# Patient Record
Sex: Male | Born: 1935
Health system: Southern US, Community
[De-identification: ages and names within clinical notes are randomized; demographics above are authoritative.]

## PROBLEM LIST (undated history)

## (undated) DIAGNOSIS — I10 Essential (primary) hypertension: Secondary | ICD-10-CM

## (undated) DIAGNOSIS — J449 Chronic obstructive pulmonary disease, unspecified: Secondary | ICD-10-CM

---

## 2015-02-03 DIAGNOSIS — B9620 Unspecified Escherichia coli [E. coli] as the cause of diseases classified elsewhere: Secondary | ICD-10-CM | POA: Diagnosis not present

## 2015-02-03 DIAGNOSIS — Z87891 Personal history of nicotine dependence: Secondary | ICD-10-CM | POA: Diagnosis not present

## 2015-02-03 DIAGNOSIS — A4151 Sepsis due to Escherichia coli [E. coli]: Secondary | ICD-10-CM | POA: Diagnosis not present

## 2015-02-03 DIAGNOSIS — R069 Unspecified abnormalities of breathing: Secondary | ICD-10-CM | POA: Diagnosis not present

## 2015-02-03 DIAGNOSIS — R05 Cough: Secondary | ICD-10-CM | POA: Diagnosis not present

## 2015-02-03 DIAGNOSIS — I160 Hypertensive urgency: Secondary | ICD-10-CM | POA: Diagnosis not present

## 2015-02-03 DIAGNOSIS — J441 Chronic obstructive pulmonary disease with (acute) exacerbation: Secondary | ICD-10-CM | POA: Diagnosis not present

## 2015-02-03 DIAGNOSIS — R0602 Shortness of breath: Secondary | ICD-10-CM | POA: Diagnosis not present

## 2015-02-03 DIAGNOSIS — R918 Other nonspecific abnormal finding of lung field: Secondary | ICD-10-CM | POA: Diagnosis not present

## 2015-02-03 DIAGNOSIS — Z87440 Personal history of urinary (tract) infections: Secondary | ICD-10-CM | POA: Diagnosis not present

## 2015-02-03 DIAGNOSIS — N179 Acute kidney failure, unspecified: Secondary | ICD-10-CM | POA: Diagnosis not present

## 2015-02-03 DIAGNOSIS — J181 Lobar pneumonia, unspecified organism: Secondary | ICD-10-CM | POA: Diagnosis not present

## 2015-02-03 DIAGNOSIS — N390 Urinary tract infection, site not specified: Secondary | ICD-10-CM | POA: Diagnosis not present

## 2015-02-03 DIAGNOSIS — J180 Bronchopneumonia, unspecified organism: Secondary | ICD-10-CM | POA: Diagnosis not present

## 2015-02-03 DIAGNOSIS — J9601 Acute respiratory failure with hypoxia: Secondary | ICD-10-CM | POA: Diagnosis not present

## 2015-02-03 DIAGNOSIS — E860 Dehydration: Secondary | ICD-10-CM | POA: Diagnosis not present

## 2015-02-03 DIAGNOSIS — J155 Pneumonia due to Escherichia coli: Secondary | ICD-10-CM | POA: Diagnosis not present

## 2015-02-16 DIAGNOSIS — Z1389 Encounter for screening for other disorder: Secondary | ICD-10-CM | POA: Diagnosis not present

## 2015-02-16 DIAGNOSIS — Z79899 Other long term (current) drug therapy: Secondary | ICD-10-CM | POA: Diagnosis not present

## 2015-02-16 DIAGNOSIS — Z789 Other specified health status: Secondary | ICD-10-CM | POA: Diagnosis not present

## 2015-02-16 DIAGNOSIS — I10 Essential (primary) hypertension: Secondary | ICD-10-CM | POA: Diagnosis not present

## 2015-02-16 DIAGNOSIS — Z125 Encounter for screening for malignant neoplasm of prostate: Secondary | ICD-10-CM | POA: Diagnosis not present

## 2015-02-16 DIAGNOSIS — J449 Chronic obstructive pulmonary disease, unspecified: Secondary | ICD-10-CM | POA: Diagnosis not present

## 2015-02-16 DIAGNOSIS — Z9181 History of falling: Secondary | ICD-10-CM | POA: Diagnosis not present

## 2015-03-23 DIAGNOSIS — I10 Essential (primary) hypertension: Secondary | ICD-10-CM | POA: Diagnosis not present

## 2015-03-29 DIAGNOSIS — R110 Nausea: Secondary | ICD-10-CM | POA: Diagnosis not present

## 2015-03-29 DIAGNOSIS — R61 Generalized hyperhidrosis: Secondary | ICD-10-CM | POA: Diagnosis not present

## 2015-03-29 DIAGNOSIS — R911 Solitary pulmonary nodule: Secondary | ICD-10-CM | POA: Diagnosis not present

## 2015-03-29 DIAGNOSIS — R531 Weakness: Secondary | ICD-10-CM | POA: Diagnosis not present

## 2015-03-29 DIAGNOSIS — J439 Emphysema, unspecified: Secondary | ICD-10-CM | POA: Diagnosis not present

## 2015-03-29 DIAGNOSIS — R197 Diarrhea, unspecified: Secondary | ICD-10-CM | POA: Diagnosis not present

## 2015-03-29 DIAGNOSIS — R42 Dizziness and giddiness: Secondary | ICD-10-CM | POA: Diagnosis not present

## 2015-03-29 DIAGNOSIS — R404 Transient alteration of awareness: Secondary | ICD-10-CM | POA: Diagnosis not present

## 2015-06-23 DIAGNOSIS — Z87891 Personal history of nicotine dependence: Secondary | ICD-10-CM | POA: Diagnosis not present

## 2015-06-23 DIAGNOSIS — R0602 Shortness of breath: Secondary | ICD-10-CM | POA: Diagnosis not present

## 2015-06-23 DIAGNOSIS — J209 Acute bronchitis, unspecified: Secondary | ICD-10-CM | POA: Diagnosis not present

## 2015-06-23 DIAGNOSIS — R05 Cough: Secondary | ICD-10-CM | POA: Diagnosis not present

## 2015-06-23 DIAGNOSIS — M1990 Unspecified osteoarthritis, unspecified site: Secondary | ICD-10-CM | POA: Diagnosis not present

## 2015-06-23 DIAGNOSIS — N189 Chronic kidney disease, unspecified: Secondary | ICD-10-CM | POA: Diagnosis not present

## 2015-06-23 DIAGNOSIS — I129 Hypertensive chronic kidney disease with stage 1 through stage 4 chronic kidney disease, or unspecified chronic kidney disease: Secondary | ICD-10-CM | POA: Diagnosis not present

## 2015-06-23 DIAGNOSIS — R0902 Hypoxemia: Secondary | ICD-10-CM | POA: Diagnosis not present

## 2015-06-23 DIAGNOSIS — Z79899 Other long term (current) drug therapy: Secondary | ICD-10-CM | POA: Diagnosis not present

## 2015-06-23 DIAGNOSIS — J441 Chronic obstructive pulmonary disease with (acute) exacerbation: Secondary | ICD-10-CM | POA: Diagnosis not present

## 2015-07-18 DIAGNOSIS — R5381 Other malaise: Secondary | ICD-10-CM | POA: Diagnosis not present

## 2015-07-18 DIAGNOSIS — R5383 Other fatigue: Secondary | ICD-10-CM | POA: Diagnosis not present

## 2015-07-18 DIAGNOSIS — N183 Chronic kidney disease, stage 3 (moderate): Secondary | ICD-10-CM | POA: Diagnosis not present

## 2015-07-18 DIAGNOSIS — J449 Chronic obstructive pulmonary disease, unspecified: Secondary | ICD-10-CM | POA: Diagnosis not present

## 2015-07-18 DIAGNOSIS — D649 Anemia, unspecified: Secondary | ICD-10-CM | POA: Diagnosis not present

## 2015-12-01 DIAGNOSIS — L300 Nummular dermatitis: Secondary | ICD-10-CM | POA: Diagnosis not present

## 2015-12-01 DIAGNOSIS — R233 Spontaneous ecchymoses: Secondary | ICD-10-CM | POA: Diagnosis not present

## 2015-12-01 DIAGNOSIS — C44311 Basal cell carcinoma of skin of nose: Secondary | ICD-10-CM | POA: Diagnosis not present

## 2015-12-31 DIAGNOSIS — M1990 Unspecified osteoarthritis, unspecified site: Secondary | ICD-10-CM | POA: Diagnosis not present

## 2015-12-31 DIAGNOSIS — M6281 Muscle weakness (generalized): Secondary | ICD-10-CM | POA: Diagnosis not present

## 2015-12-31 DIAGNOSIS — J449 Chronic obstructive pulmonary disease, unspecified: Secondary | ICD-10-CM | POA: Diagnosis not present

## 2015-12-31 DIAGNOSIS — J181 Lobar pneumonia, unspecified organism: Secondary | ICD-10-CM | POA: Diagnosis not present

## 2015-12-31 DIAGNOSIS — J984 Other disorders of lung: Secondary | ICD-10-CM | POA: Diagnosis not present

## 2015-12-31 DIAGNOSIS — J439 Emphysema, unspecified: Secondary | ICD-10-CM | POA: Diagnosis not present

## 2015-12-31 DIAGNOSIS — Z79899 Other long term (current) drug therapy: Secondary | ICD-10-CM | POA: Diagnosis not present

## 2015-12-31 DIAGNOSIS — I10 Essential (primary) hypertension: Secondary | ICD-10-CM | POA: Diagnosis not present

## 2015-12-31 DIAGNOSIS — J9601 Acute respiratory failure with hypoxia: Secondary | ICD-10-CM | POA: Diagnosis not present

## 2015-12-31 DIAGNOSIS — Z87891 Personal history of nicotine dependence: Secondary | ICD-10-CM | POA: Diagnosis not present

## 2015-12-31 DIAGNOSIS — J9690 Respiratory failure, unspecified, unspecified whether with hypoxia or hypercapnia: Secondary | ICD-10-CM | POA: Diagnosis not present

## 2015-12-31 DIAGNOSIS — R531 Weakness: Secondary | ICD-10-CM | POA: Diagnosis not present

## 2015-12-31 DIAGNOSIS — R848 Other abnormal findings in specimens from respiratory organs and thorax: Secondary | ICD-10-CM | POA: Diagnosis not present

## 2015-12-31 DIAGNOSIS — B9689 Other specified bacterial agents as the cause of diseases classified elsewhere: Secondary | ICD-10-CM | POA: Diagnosis not present

## 2015-12-31 DIAGNOSIS — J440 Chronic obstructive pulmonary disease with acute lower respiratory infection: Secondary | ICD-10-CM | POA: Diagnosis not present

## 2015-12-31 DIAGNOSIS — R911 Solitary pulmonary nodule: Secondary | ICD-10-CM | POA: Diagnosis not present

## 2015-12-31 DIAGNOSIS — R7989 Other specified abnormal findings of blood chemistry: Secondary | ICD-10-CM | POA: Diagnosis not present

## 2015-12-31 DIAGNOSIS — J189 Pneumonia, unspecified organism: Secondary | ICD-10-CM | POA: Diagnosis not present

## 2016-02-03 DIAGNOSIS — I10 Essential (primary) hypertension: Secondary | ICD-10-CM | POA: Diagnosis not present

## 2016-02-03 DIAGNOSIS — Z23 Encounter for immunization: Secondary | ICD-10-CM | POA: Diagnosis not present

## 2016-02-03 DIAGNOSIS — A310 Pulmonary mycobacterial infection: Secondary | ICD-10-CM | POA: Diagnosis not present

## 2016-02-10 DIAGNOSIS — E860 Dehydration: Secondary | ICD-10-CM | POA: Diagnosis not present

## 2016-02-10 DIAGNOSIS — K8020 Calculus of gallbladder without cholecystitis without obstruction: Secondary | ICD-10-CM | POA: Diagnosis not present

## 2016-02-10 DIAGNOSIS — R197 Diarrhea, unspecified: Secondary | ICD-10-CM | POA: Diagnosis not present

## 2016-02-16 DIAGNOSIS — K529 Noninfective gastroenteritis and colitis, unspecified: Secondary | ICD-10-CM | POA: Diagnosis not present

## 2016-05-21 DIAGNOSIS — L300 Nummular dermatitis: Secondary | ICD-10-CM | POA: Diagnosis not present

## 2016-05-21 DIAGNOSIS — L821 Other seborrheic keratosis: Secondary | ICD-10-CM | POA: Diagnosis not present

## 2016-05-21 DIAGNOSIS — L570 Actinic keratosis: Secondary | ICD-10-CM | POA: Diagnosis not present

## 2016-05-21 DIAGNOSIS — L578 Other skin changes due to chronic exposure to nonionizing radiation: Secondary | ICD-10-CM | POA: Diagnosis not present

## 2016-06-04 DIAGNOSIS — C4442 Squamous cell carcinoma of skin of scalp and neck: Secondary | ICD-10-CM | POA: Diagnosis not present

## 2016-06-04 DIAGNOSIS — L578 Other skin changes due to chronic exposure to nonionizing radiation: Secondary | ICD-10-CM | POA: Diagnosis not present

## 2016-06-04 DIAGNOSIS — L570 Actinic keratosis: Secondary | ICD-10-CM | POA: Diagnosis not present

## 2016-06-04 DIAGNOSIS — L300 Nummular dermatitis: Secondary | ICD-10-CM | POA: Diagnosis not present

## 2016-06-04 DIAGNOSIS — C44212 Basal cell carcinoma of skin of right ear and external auricular canal: Secondary | ICD-10-CM | POA: Diagnosis not present

## 2016-06-04 DIAGNOSIS — C44219 Basal cell carcinoma of skin of left ear and external auricular canal: Secondary | ICD-10-CM | POA: Diagnosis not present

## 2016-07-29 ENCOUNTER — Ambulatory Visit (HOSPITAL_COMMUNITY)
Admission: EM | Admit: 2016-07-29 | Discharge: 2016-07-29 | Disposition: A | Discharge status: Home / Self Care | Payer: Medicare HMO | Attending: Emergency Medicine | Admitting: Emergency Medicine

## 2016-07-29 ENCOUNTER — Emergency Department (HOSPITAL_COMMUNITY): Payer: Medicare HMO | Admitting: Certified Registered Nurse Anesthetist

## 2016-07-29 ENCOUNTER — Ambulatory Visit (HOSPITAL_COMMUNITY): Admit: 2016-07-29 | Payer: Medicare HMO | Admitting: Gastroenterology

## 2016-07-29 ENCOUNTER — Encounter (HOSPITAL_COMMUNITY): Payer: Self-pay

## 2016-07-29 ENCOUNTER — Encounter (HOSPITAL_COMMUNITY)
Admission: EM | Disposition: A | Discharge status: Home / Self Care | Payer: Self-pay | Source: Home / Self Care | Attending: Emergency Medicine

## 2016-07-29 DIAGNOSIS — E860 Dehydration: Secondary | ICD-10-CM | POA: Diagnosis not present

## 2016-07-29 DIAGNOSIS — R1310 Dysphagia, unspecified: Secondary | ICD-10-CM | POA: Diagnosis not present

## 2016-07-29 DIAGNOSIS — R410 Disorientation, unspecified: Secondary | ICD-10-CM | POA: Diagnosis not present

## 2016-07-29 DIAGNOSIS — K269 Duodenal ulcer, unspecified as acute or chronic, without hemorrhage or perforation: Secondary | ICD-10-CM | POA: Insufficient documentation

## 2016-07-29 DIAGNOSIS — R0602 Shortness of breath: Secondary | ICD-10-CM | POA: Diagnosis not present

## 2016-07-29 DIAGNOSIS — T18108A Unspecified foreign body in esophagus causing other injury, initial encounter: Secondary | ICD-10-CM

## 2016-07-29 DIAGNOSIS — J449 Chronic obstructive pulmonary disease, unspecified: Secondary | ICD-10-CM | POA: Diagnosis not present

## 2016-07-29 DIAGNOSIS — T18128A Food in esophagus causing other injury, initial encounter: Secondary | ICD-10-CM | POA: Insufficient documentation

## 2016-07-29 DIAGNOSIS — I10 Essential (primary) hypertension: Secondary | ICD-10-CM | POA: Insufficient documentation

## 2016-07-29 DIAGNOSIS — K210 Gastro-esophageal reflux disease with esophagitis: Secondary | ICD-10-CM | POA: Insufficient documentation

## 2016-07-29 DIAGNOSIS — K222 Esophageal obstruction: Secondary | ICD-10-CM | POA: Diagnosis not present

## 2016-07-29 HISTORY — PX: ESOPHAGOGASTRODUODENOSCOPY: SHX5428

## 2016-07-29 HISTORY — DX: Chronic obstructive pulmonary disease, unspecified: J44.9

## 2016-07-29 HISTORY — DX: Essential (primary) hypertension: I10

## 2016-07-29 LAB — POCT I-STAT, CHEM 8
BUN: 35 mg/dL — ABNORMAL HIGH (ref 6–20)
Calcium, Ion: 1.24 mmol/L (ref 1.15–1.40)
Chloride: 108 mmol/L (ref 101–111)
Creatinine, Ser: 1.1 mg/dL (ref 0.61–1.24)
GLUCOSE: 79 mg/dL (ref 65–99)
HCT: 35 % — ABNORMAL LOW (ref 39.0–52.0)
HEMOGLOBIN: 11.9 g/dL — AB (ref 13.0–17.0)
Potassium: 4.3 mmol/L (ref 3.5–5.1)
SODIUM: 142 mmol/L (ref 135–145)
TCO2: 25 mmol/L (ref 0–100)

## 2016-07-29 SURGERY — EGD (ESOPHAGOGASTRODUODENOSCOPY)
Anesthesia: General

## 2016-07-29 MED ORDER — ONDANSETRON HCL 4 MG/2ML IJ SOLN
INTRAMUSCULAR | Status: DC | PRN
  Administered 2016-07-29: 4 mg via INTRAVENOUS

## 2016-07-29 MED ORDER — LACTATED RINGERS IV SOLN
INTRAVENOUS | Status: DC | PRN
  Administered 2016-07-29: 10:00:00 via INTRAVENOUS

## 2016-07-29 MED ORDER — FENTANYL CITRATE (PF) 100 MCG/2ML IJ SOLN
INTRAMUSCULAR | Status: DC | PRN
  Administered 2016-07-29: 100 ug via INTRAVENOUS

## 2016-07-29 MED ORDER — PROPOFOL 10 MG/ML IV BOLUS
INTRAVENOUS | Status: DC | PRN
  Administered 2016-07-29: 110 mg via INTRAVENOUS

## 2016-07-29 MED ORDER — LIDOCAINE HCL (CARDIAC) 20 MG/ML IV SOLN
INTRAVENOUS | Status: DC | PRN
  Administered 2016-07-29: 100 mg via INTRAVENOUS

## 2016-07-29 MED ORDER — SUCCINYLCHOLINE CHLORIDE 20 MG/ML IJ SOLN
INTRAMUSCULAR | Status: DC | PRN
  Administered 2016-07-29: 140 mg via INTRAVENOUS

## 2016-07-29 MED ORDER — MEPERIDINE HCL 100 MG/ML IJ SOLN: 6.2500 mg | INTRAMUSCULAR | Status: DC | PRN

## 2016-07-29 MED ORDER — HYDROMORPHONE HCL 1 MG/ML IJ SOLN: 0.2500 mg | INTRAMUSCULAR | Status: DC | PRN

## 2016-07-29 MED ORDER — ONDANSETRON HCL 4 MG/2ML IJ SOLN: 4.0000 mg | Freq: Once | INTRAMUSCULAR | Status: DC | PRN

## 2016-07-29 NOTE — ED Provider Notes (Signed)
Patient transferred from another facility.  On exam he is awake, alert, in no distress. Vital signs unremarkable, I discussed transfer with EMS providers. Subsequently I discussed the patient's case with our GI colleagues, for whom the patient was sent due to need for evaluation.   Carmin Muskrat, MD 07/29/16 [phone]

## 2016-07-29 NOTE — Op Note (Addendum)
[facility] Patient Name: Ryan Hodge Procedure Date : 07/29/2016 MRN: [id number] Attending MD: Juanita Craver , MD Date of Birth: Jan 01, 1936 CSN: [id number] Age: 81 Admit Type: Inpatient Procedure:                EGD with removal of food impaction. Indications:              Dysphagia, Gastro-esophageal reflux disease. Providers:                Juanita Craver, MD, Vista Lawman, RN, Cletis Athens,                            Technician Referring MD:             Aggie Cosier, MD, Christa See, MD Medicines:                Total IV Anesthesia (TIVA) Complications:            No immediate complications. Estimated Blood Loss:     Estimated blood loss was minimal. Procedure:                Pre-anesthesia assessment:- Prior to the procedure,                            a history and physical was performed, and patient                            medications and allergies were reviewed. The                            patient's tolerance of previous anesthesia was also                            reviewed. The risks and benefits of the procedure                            and the sedation options and risks were discussed                            with the patient. All questions were answered, and                            informed consent was obtained. Prior                            anticoagulants: The patient has taken previous                            NSAID medication, last dose was 3 days prior to                            procedure. ASA Grade assessment: II - A patient                            with mild systemic disease. After reviewing the  risks and benefits, the patient was deemed in                            satisfactory condition to undergo the procedure.                            After obtaining informed consent, the endoscope was                            passed under direct vision. Throughout the   procedure, the patient's blood pressure, pulse, and                            oxygen saturations were monitored continuously. The                            EG-[id number] ([id number]) scope was introduced through the                            mouth, and advanced to the second part of duodenum.                            The EGD was done without difficulty. The patient                            tolerated the procedure well. Scope In: Scope Out: Findings:      A large meat bolus was noted, impacted at the gastroesophageal junction       @ 40 cm; this was broken up into smaller fragments with a Talon grasper       and then gently "pushed" into the stomach; Grade IV esophagitis was       noted in the distal esophagus after the bolus was removed with minimal       bleeding. Estimated blood loss was minimal.      The entire examined stomach was normal.      The cardia and gastric fundus were normal on retroflexion.      One small non-bleeding superficial duodenal ulcer with no stigmata of       bleeding was found in the duodenal bulb.      The first portion of the duodenum was normal. Impression:               - Large meat bolus impacted at GEJ-removed with a                            Talon grasper-see description above.                           - Grade IV esophagitis noted in the distal                            esophagus.                           - Normal stomach.                           -  One non-bleeding duodenal ulcer in the duodenal                            bulb with no stigmata of bleeding.                           - Normal first portion of the duodenum. Moderate Sedation:      MAC used. Recommendation:           - Mechanical soft diet for now.                           - Continue present medications.                           - Use Prilosec (Omeprazole) 40 mg PO daily.                           - Crafate suspension 1 gm PO QID for 1 month.                           - No  Aspirin, Ibuprofen, Naproxen, or other                            non-steroidal anti-inflammatory drugs for now.                           - Return to your GI doctor, Dr. Margaretha Sheffield in 1                            week. Procedure Code(s):        --- Professional ---                           [phone], Esophagogastroduodenoscopy, flexible,                            transoral; with removal of foreign body(s) Diagnosis Code(s):        --- Professional ---                           R13.10, Dysphagia, unspecified                           K21.0, Gastro-esophageal reflux disease with                            esophagitis                           K26.9, Duodenal ulcer, unspecified as acute or                            chronic, without hemorrhage or perforation                           A45.364W, Food in esophagus causing  other injury,                            initial encounter CPT copyright 2016 [employer]. All rights reserved. The codes documented in this report are preliminary and upon coder review may  be revised to meet current compliance requirements. Juanita Craver, MD Juanita Craver, MD 07/29/2016 10:56:37 AM This report has been signed electronically. Number of Addenda: 0

## 2016-07-29 NOTE — Anesthesia Procedure Notes (Signed)
Procedure Name: Intubation Date/Time: 07/29/2016 10:29 AM Performed by: Oletta Lamas Pre-anesthesia Checklist: Patient identified, Emergency Drugs available, Suction available and Patient being monitored Patient Re-evaluated:Patient Re-evaluated prior to inductionOxygen Delivery Method: Circle System Utilized Preoxygenation: Pre-oxygenation with 100% oxygen Intubation Type: IV induction, Rapid sequence and Cricoid Pressure applied Laryngoscope Size: Mac and 4 Grade View: Grade I Tube type: Oral Tube size: 8.0 mm Number of attempts: 1 Airway Equipment and Method: Stylet Placement Confirmation: ETT inserted through vocal cords under direct vision,  positive ETCO2 and breath sounds checked- equal and bilateral Secured at: 23 cm Tube secured with: Tape Dental Injury: Teeth and Oropharynx as per pre-operative assessment

## 2016-07-29 NOTE — ED Triage Notes (Signed)
Pt brought by [facility] from [place] for swallowing difficulty and nausea. Pt c/o not being able to swallow any foods/liquids for 2-3 days. Pt a&ox4. Pt is HOH.

## 2016-07-29 NOTE — Transfer of Care (Signed)
Immediate Anesthesia Transfer of Care Note  Patient: Ryan Hodge  Procedure(s) Performed: Procedure(s): ESOPHAGOGASTRODUODENOSCOPY (EGD) (N/A)  Patient Location: PACU  Anesthesia Type:General  Level of Consciousness: drowsy and patient cooperative  Airway & Oxygen Therapy: Patient Spontanous Breathing and Patient connected to nasal cannula oxygen  Post-op Assessment: Report given to RN and Post -op Vital signs reviewed and stable  Post vital signs: Reviewed and stable  Last Vitals:  Vitals:   07/29/16 0930 07/29/16 0955  BP: 131/60 (!) 163/57  Pulse: 69 76  Resp:  15  Temp:  36.7 C    Last Pain:  Vitals:   07/29/16 0955  TempSrc: Oral         Complications: No apparent anesthesia complications

## 2016-07-29 NOTE — Anesthesia Preprocedure Evaluation (Signed)
Anesthesia Evaluation  Patient identified by MRN, date of birth, ID band Patient awake    Reviewed: Allergy & Precautions, NPO status , Patient's Chart, lab work & pertinent test results  Airway Mallampati: I  TM Distance: >3 FB Neck ROM: Full    Dental   Pulmonary COPD,    Pulmonary exam normal        Cardiovascular hypertension, Pt. on medications Normal cardiovascular exam     Neuro/Psych    GI/Hepatic   Endo/Other    Renal/GU      Musculoskeletal   Abdominal   Peds  Hematology   Anesthesia Other Findings   Reproductive/Obstetrics                             Anesthesia Physical Anesthesia Plan  ASA: III  Anesthesia Plan: General   Post-op Pain Management:    Induction: Intravenous  Airway Management Planned: Oral ETT  Additional Equipment:   Intra-op Plan:   Post-operative Plan: Extubation in OR  Informed Consent: I have reviewed the patients History and Physical, chart, labs and discussed the procedure including the risks, benefits and alternatives for the proposed anesthesia with the patient or authorized representative who has indicated his/her understanding and acceptance.     Plan Discussed with: CRNA and Surgeon  Anesthesia Plan Comments:         Anesthesia Quick Evaluation

## 2016-07-29 NOTE — Anesthesia Postprocedure Evaluation (Signed)
Anesthesia Post Note  Patient: Ryan Hodge  Procedure(s) Performed: Procedure(s) (LRB): ESOPHAGOGASTRODUODENOSCOPY (EGD) (N/A)  Patient location during evaluation: PACU Anesthesia Type: General Level of consciousness: awake and alert Pain management: pain level controlled Vital Signs Assessment: post-procedure vital signs reviewed and stable Respiratory status: spontaneous breathing, nonlabored ventilation, respiratory function stable and patient connected to nasal cannula oxygen Cardiovascular status: blood pressure returned to baseline and stable Postop Assessment: no signs of nausea or vomiting Anesthetic complications: no       Last Vitals:  Vitals:   07/29/16 1106 07/29/16 1121  BP: 125/75 138/79  Pulse: 83 74  Resp: 19 19  Temp:      Last Pain:  Vitals:   07/29/16 0955  TempSrc: Oral                 [name]

## 2016-07-29 NOTE — Consult Note (Addendum)
Unassigned patient Ryan Hodge, water quality Reason for Consult: Food impaction/difficulty swallowing. Referring Physician: ER MD-Dr. Vanita Panda.  Ryan Hodge is an 81 y.o. male.  HPI: 81 year old white male here for removal of a food impcction that he has had for 3 days. He had some chicken 3 days and has not been able to swallow any solids or liquids since then.  PMH: HTN, COD, HOH.  No past surgical history on file.  No family history on file.  Social History:  has no tobacco, alcohol, and drug history on file.  Allergies: Allergies not on file  Medications: I have reviewed the patient's current medications.  No results found for this or any previous visit (from the past 48 hour(s)).  No results found.  Review of Systems  Constitutional: Negative.   HENT: Negative.   Eyes: Negative.   Respiratory: Negative.   Cardiovascular: Negative.   Gastrointestinal: Positive for heartburn, nausea and vomiting. Negative for abdominal pain, blood in stool, constipation, diarrhea and melena.  Genitourinary: Negative.   Musculoskeletal: Positive for joint pain.  Skin: Negative.   Neurological: Negative.   Endo/Heme/Allergies: Negative.   Psychiatric/Behavioral: Negative.    Blood pressure 132/70, pulse 81, temperature 98.3 F (36.8 C), temperature source Oral, resp. rate 18, height 6\' 2"  (1.88 m), weight 74.8 kg (165 lb), SpO2 94 %. Physical Exam  Constitutional: He is oriented to person, place, and time. He appears well-developed and well-nourished.  HENT:  Head: Normocephalic and atraumatic.  Right Ear: Decreased hearing is noted.  edentulous  Eyes: Conjunctivae and EOM are normal. Pupils are equal, round, and reactive to light.  Neck: Normal range of motion. Neck supple.  Cardiovascular: Normal rate and regular rhythm.   Respiratory: Effort normal and breath sounds normal.  GI: Soft. Bowel sounds are normal.  Musculoskeletal: Normal range of motion.  Neurological: He is  alert and oriented to person, place, and time.  Skin: Skin is warm and dry.  Psychiatric: He has a normal mood and affect. His behavior is normal. Judgment and thought content normal.   Assessment/Plan: Dysphagia/food impaction-proceed with a EGD at this time.  Grier Czerwinski 07/29/2016, 9:08 AM

## 2016-12-11 DIAGNOSIS — L299 Pruritus, unspecified: Secondary | ICD-10-CM | POA: Diagnosis not present

## 2016-12-11 DIAGNOSIS — L300 Nummular dermatitis: Secondary | ICD-10-CM | POA: Diagnosis not present

## 2016-12-18 DIAGNOSIS — Z01818 Encounter for other preprocedural examination: Secondary | ICD-10-CM | POA: Diagnosis not present

## 2016-12-18 DIAGNOSIS — H2511 Age-related nuclear cataract, right eye: Secondary | ICD-10-CM | POA: Diagnosis not present

## 2016-12-24 DIAGNOSIS — H2511 Age-related nuclear cataract, right eye: Secondary | ICD-10-CM | POA: Diagnosis not present

## 2016-12-24 DIAGNOSIS — H25811 Combined forms of age-related cataract, right eye: Secondary | ICD-10-CM | POA: Diagnosis not present

## 2016-12-29 DIAGNOSIS — R55 Syncope and collapse: Secondary | ICD-10-CM | POA: Diagnosis not present

## 2016-12-29 DIAGNOSIS — R531 Weakness: Secondary | ICD-10-CM | POA: Diagnosis not present

## 2016-12-29 DIAGNOSIS — E860 Dehydration: Secondary | ICD-10-CM | POA: Diagnosis not present

## 2016-12-29 DIAGNOSIS — R404 Transient alteration of awareness: Secondary | ICD-10-CM | POA: Diagnosis not present

## 2017-01-02 DIAGNOSIS — R55 Syncope and collapse: Secondary | ICD-10-CM | POA: Diagnosis not present

## 2017-01-02 DIAGNOSIS — Z23 Encounter for immunization: Secondary | ICD-10-CM | POA: Diagnosis not present

## 2017-01-02 DIAGNOSIS — Z1389 Encounter for screening for other disorder: Secondary | ICD-10-CM | POA: Diagnosis not present

## 2017-01-02 DIAGNOSIS — Z0000 Encounter for general adult medical examination without abnormal findings: Secondary | ICD-10-CM | POA: Diagnosis not present

## 2017-01-02 DIAGNOSIS — I10 Essential (primary) hypertension: Secondary | ICD-10-CM | POA: Diagnosis not present

## 2017-01-02 DIAGNOSIS — Z9181 History of falling: Secondary | ICD-10-CM | POA: Diagnosis not present

## 2017-02-26 DIAGNOSIS — L300 Nummular dermatitis: Secondary | ICD-10-CM | POA: Diagnosis not present

## 2017-02-26 DIAGNOSIS — L299 Pruritus, unspecified: Secondary | ICD-10-CM | POA: Diagnosis not present

## 2017-06-12 DIAGNOSIS — L578 Other skin changes due to chronic exposure to nonionizing radiation: Secondary | ICD-10-CM | POA: Diagnosis not present

## 2017-06-12 DIAGNOSIS — T7840XA Allergy, unspecified, initial encounter: Secondary | ICD-10-CM | POA: Diagnosis not present

## 2017-06-12 DIAGNOSIS — L300 Nummular dermatitis: Secondary | ICD-10-CM | POA: Diagnosis not present

## 2017-08-08 DIAGNOSIS — K8020 Calculus of gallbladder without cholecystitis without obstruction: Secondary | ICD-10-CM | POA: Diagnosis not present

## 2017-08-08 DIAGNOSIS — R0902 Hypoxemia: Secondary | ICD-10-CM | POA: Diagnosis not present

## 2017-08-08 DIAGNOSIS — J181 Lobar pneumonia, unspecified organism: Secondary | ICD-10-CM | POA: Diagnosis not present

## 2017-08-08 DIAGNOSIS — K5730 Diverticulosis of large intestine without perforation or abscess without bleeding: Secondary | ICD-10-CM | POA: Diagnosis not present

## 2017-08-08 DIAGNOSIS — R739 Hyperglycemia, unspecified: Secondary | ICD-10-CM | POA: Diagnosis not present

## 2017-08-08 DIAGNOSIS — I10 Essential (primary) hypertension: Secondary | ICD-10-CM | POA: Diagnosis not present

## 2017-08-08 DIAGNOSIS — N179 Acute kidney failure, unspecified: Secondary | ICD-10-CM | POA: Diagnosis not present

## 2017-08-08 DIAGNOSIS — J449 Chronic obstructive pulmonary disease, unspecified: Secondary | ICD-10-CM | POA: Diagnosis not present

## 2017-08-08 DIAGNOSIS — J440 Chronic obstructive pulmonary disease with acute lower respiratory infection: Secondary | ICD-10-CM | POA: Diagnosis not present

## 2017-08-08 DIAGNOSIS — I160 Hypertensive urgency: Secondary | ICD-10-CM | POA: Diagnosis not present

## 2017-08-08 DIAGNOSIS — I719 Aortic aneurysm of unspecified site, without rupture: Secondary | ICD-10-CM | POA: Diagnosis not present

## 2017-08-08 DIAGNOSIS — A419 Sepsis, unspecified organism: Secondary | ICD-10-CM | POA: Diagnosis not present

## 2017-08-08 DIAGNOSIS — I714 Abdominal aortic aneurysm, without rupture: Secondary | ICD-10-CM | POA: Diagnosis not present

## 2017-08-08 DIAGNOSIS — B9620 Unspecified Escherichia coli [E. coli] as the cause of diseases classified elsewhere: Secondary | ICD-10-CM | POA: Diagnosis not present

## 2017-08-08 DIAGNOSIS — R8271 Bacteriuria: Secondary | ICD-10-CM | POA: Diagnosis not present

## 2017-08-08 DIAGNOSIS — J9601 Acute respiratory failure with hypoxia: Secondary | ICD-10-CM | POA: Diagnosis not present

## 2017-08-09 DIAGNOSIS — I719 Aortic aneurysm of unspecified site, without rupture: Secondary | ICD-10-CM | POA: Diagnosis not present

## 2017-08-09 DIAGNOSIS — I160 Hypertensive urgency: Secondary | ICD-10-CM | POA: Diagnosis not present

## 2017-08-09 DIAGNOSIS — J181 Lobar pneumonia, unspecified organism: Secondary | ICD-10-CM | POA: Diagnosis not present

## 2017-08-09 DIAGNOSIS — I10 Essential (primary) hypertension: Secondary | ICD-10-CM | POA: Diagnosis not present

## 2017-08-09 DIAGNOSIS — N179 Acute kidney failure, unspecified: Secondary | ICD-10-CM | POA: Diagnosis not present

## 2017-08-09 DIAGNOSIS — J9601 Acute respiratory failure with hypoxia: Secondary | ICD-10-CM | POA: Diagnosis not present

## 2017-08-09 DIAGNOSIS — J449 Chronic obstructive pulmonary disease, unspecified: Secondary | ICD-10-CM | POA: Diagnosis not present

## 2017-08-10 DIAGNOSIS — N179 Acute kidney failure, unspecified: Secondary | ICD-10-CM | POA: Diagnosis not present

## 2017-08-10 DIAGNOSIS — J181 Lobar pneumonia, unspecified organism: Secondary | ICD-10-CM | POA: Diagnosis not present

## 2017-08-10 DIAGNOSIS — J9601 Acute respiratory failure with hypoxia: Secondary | ICD-10-CM | POA: Diagnosis not present

## 2017-08-10 DIAGNOSIS — I10 Essential (primary) hypertension: Secondary | ICD-10-CM | POA: Diagnosis not present

## 2017-08-10 DIAGNOSIS — I719 Aortic aneurysm of unspecified site, without rupture: Secondary | ICD-10-CM | POA: Diagnosis not present

## 2017-08-10 DIAGNOSIS — I160 Hypertensive urgency: Secondary | ICD-10-CM | POA: Diagnosis not present

## 2017-08-10 DIAGNOSIS — J449 Chronic obstructive pulmonary disease, unspecified: Secondary | ICD-10-CM | POA: Diagnosis not present

## 2017-08-11 DIAGNOSIS — J181 Lobar pneumonia, unspecified organism: Secondary | ICD-10-CM | POA: Diagnosis not present

## 2017-08-11 DIAGNOSIS — J9601 Acute respiratory failure with hypoxia: Secondary | ICD-10-CM | POA: Diagnosis not present

## 2017-08-11 DIAGNOSIS — I719 Aortic aneurysm of unspecified site, without rupture: Secondary | ICD-10-CM | POA: Diagnosis not present

## 2017-08-11 DIAGNOSIS — I160 Hypertensive urgency: Secondary | ICD-10-CM | POA: Diagnosis not present

## 2017-08-11 DIAGNOSIS — J449 Chronic obstructive pulmonary disease, unspecified: Secondary | ICD-10-CM | POA: Diagnosis not present

## 2017-08-11 DIAGNOSIS — I10 Essential (primary) hypertension: Secondary | ICD-10-CM | POA: Diagnosis not present

## 2017-08-11 DIAGNOSIS — N179 Acute kidney failure, unspecified: Secondary | ICD-10-CM | POA: Diagnosis not present

## 2017-08-22 DIAGNOSIS — I714 Abdominal aortic aneurysm, without rupture: Secondary | ICD-10-CM | POA: Diagnosis not present

## 2017-08-22 DIAGNOSIS — Z8619 Personal history of other infectious and parasitic diseases: Secondary | ICD-10-CM | POA: Diagnosis not present

## 2017-08-22 DIAGNOSIS — Z6822 Body mass index (BMI) 22.0-22.9, adult: Secondary | ICD-10-CM | POA: Diagnosis not present

## 2017-08-22 DIAGNOSIS — N481 Balanitis: Secondary | ICD-10-CM | POA: Diagnosis not present

## 2017-08-22 DIAGNOSIS — J181 Lobar pneumonia, unspecified organism: Secondary | ICD-10-CM | POA: Diagnosis not present

## 2017-08-22 DIAGNOSIS — I10 Essential (primary) hypertension: Secondary | ICD-10-CM | POA: Diagnosis not present

## 2017-08-22 DIAGNOSIS — I700 Atherosclerosis of aorta: Secondary | ICD-10-CM | POA: Diagnosis not present

## 2017-12-10 DIAGNOSIS — R42 Dizziness and giddiness: Secondary | ICD-10-CM | POA: Diagnosis not present

## 2017-12-10 DIAGNOSIS — G4489 Other headache syndrome: Secondary | ICD-10-CM | POA: Diagnosis not present

## 2017-12-10 DIAGNOSIS — R05 Cough: Secondary | ICD-10-CM | POA: Diagnosis not present

## 2017-12-10 DIAGNOSIS — R110 Nausea: Secondary | ICD-10-CM | POA: Diagnosis not present

## 2017-12-10 DIAGNOSIS — R51 Headache: Secondary | ICD-10-CM | POA: Diagnosis not present

## 2017-12-10 DIAGNOSIS — R52 Pain, unspecified: Secondary | ICD-10-CM | POA: Diagnosis not present

## 2017-12-10 DIAGNOSIS — I10 Essential (primary) hypertension: Secondary | ICD-10-CM | POA: Diagnosis not present

## 2017-12-10 DIAGNOSIS — R509 Fever, unspecified: Secondary | ICD-10-CM | POA: Diagnosis not present

## 2017-12-10 DIAGNOSIS — R112 Nausea with vomiting, unspecified: Secondary | ICD-10-CM | POA: Diagnosis not present

## 2017-12-10 DIAGNOSIS — R079 Chest pain, unspecified: Secondary | ICD-10-CM | POA: Diagnosis not present

## 2017-12-11 DIAGNOSIS — R51 Headache: Secondary | ICD-10-CM | POA: Diagnosis not present

## 2017-12-11 DIAGNOSIS — I10 Essential (primary) hypertension: Secondary | ICD-10-CM | POA: Diagnosis not present

## 2017-12-11 DIAGNOSIS — I498 Other specified cardiac arrhythmias: Secondary | ICD-10-CM | POA: Diagnosis not present

## 2017-12-11 DIAGNOSIS — R42 Dizziness and giddiness: Secondary | ICD-10-CM | POA: Diagnosis not present

## 2017-12-11 DIAGNOSIS — K219 Gastro-esophageal reflux disease without esophagitis: Secondary | ICD-10-CM | POA: Diagnosis not present

## 2017-12-11 DIAGNOSIS — J449 Chronic obstructive pulmonary disease, unspecified: Secondary | ICD-10-CM | POA: Diagnosis not present

## 2017-12-11 DIAGNOSIS — I63233 Cerebral infarction due to unspecified occlusion or stenosis of bilateral carotid arteries: Secondary | ICD-10-CM | POA: Diagnosis not present

## 2017-12-11 DIAGNOSIS — R05 Cough: Secondary | ICD-10-CM | POA: Diagnosis not present

## 2017-12-11 DIAGNOSIS — I513 Intracardiac thrombosis, not elsewhere classified: Secondary | ICD-10-CM | POA: Diagnosis not present

## 2017-12-11 DIAGNOSIS — E785 Hyperlipidemia, unspecified: Secondary | ICD-10-CM | POA: Diagnosis not present

## 2017-12-11 DIAGNOSIS — R69 Illness, unspecified: Secondary | ICD-10-CM | POA: Diagnosis not present

## 2017-12-11 DIAGNOSIS — R410 Disorientation, unspecified: Secondary | ICD-10-CM | POA: Diagnosis not present

## 2017-12-11 DIAGNOSIS — R079 Chest pain, unspecified: Secondary | ICD-10-CM | POA: Diagnosis not present

## 2017-12-11 DIAGNOSIS — R509 Fever, unspecified: Secondary | ICD-10-CM | POA: Diagnosis not present

## 2017-12-11 DIAGNOSIS — I639 Cerebral infarction, unspecified: Secondary | ICD-10-CM | POA: Diagnosis not present

## 2017-12-11 DIAGNOSIS — Z87891 Personal history of nicotine dependence: Secondary | ICD-10-CM | POA: Diagnosis not present

## 2017-12-11 DIAGNOSIS — I6789 Other cerebrovascular disease: Secondary | ICD-10-CM | POA: Diagnosis not present

## 2017-12-11 DIAGNOSIS — Z7901 Long term (current) use of anticoagulants: Secondary | ICD-10-CM | POA: Diagnosis not present

## 2017-12-11 DIAGNOSIS — R29700 NIHSS score 0: Secondary | ICD-10-CM | POA: Diagnosis not present

## 2017-12-11 DIAGNOSIS — I6340 Cerebral infarction due to embolism of unspecified cerebral artery: Secondary | ICD-10-CM | POA: Diagnosis not present

## 2017-12-12 DIAGNOSIS — I498 Other specified cardiac arrhythmias: Secondary | ICD-10-CM

## 2017-12-12 DIAGNOSIS — I6789 Other cerebrovascular disease: Secondary | ICD-10-CM

## 2017-12-13 DIAGNOSIS — I639 Cerebral infarction, unspecified: Secondary | ICD-10-CM

## 2017-12-13 DIAGNOSIS — I6340 Cerebral infarction due to embolism of unspecified cerebral artery: Secondary | ICD-10-CM

## 2017-12-20 DIAGNOSIS — Z6822 Body mass index (BMI) 22.0-22.9, adult: Secondary | ICD-10-CM | POA: Diagnosis not present

## 2017-12-20 DIAGNOSIS — I513 Intracardiac thrombosis, not elsewhere classified: Secondary | ICD-10-CM | POA: Diagnosis not present

## 2017-12-20 DIAGNOSIS — Z09 Encounter for follow-up examination after completed treatment for conditions other than malignant neoplasm: Secondary | ICD-10-CM | POA: Diagnosis not present

## 2017-12-20 DIAGNOSIS — I639 Cerebral infarction, unspecified: Secondary | ICD-10-CM | POA: Diagnosis not present

## 2017-12-20 DIAGNOSIS — I6529 Occlusion and stenosis of unspecified carotid artery: Secondary | ICD-10-CM | POA: Diagnosis not present

## 2017-12-20 DIAGNOSIS — R413 Other amnesia: Secondary | ICD-10-CM | POA: Diagnosis not present

## 2018-01-09 DIAGNOSIS — I10 Essential (primary) hypertension: Secondary | ICD-10-CM | POA: Diagnosis not present

## 2018-01-09 DIAGNOSIS — Z79899 Other long term (current) drug therapy: Secondary | ICD-10-CM | POA: Diagnosis not present

## 2018-01-09 DIAGNOSIS — N183 Chronic kidney disease, stage 3 (moderate): Secondary | ICD-10-CM | POA: Diagnosis not present

## 2018-01-09 DIAGNOSIS — Z6822 Body mass index (BMI) 22.0-22.9, adult: Secondary | ICD-10-CM | POA: Diagnosis not present

## 2018-01-09 DIAGNOSIS — I714 Abdominal aortic aneurysm, without rupture: Secondary | ICD-10-CM | POA: Diagnosis not present

## 2018-01-09 DIAGNOSIS — Z0000 Encounter for general adult medical examination without abnormal findings: Secondary | ICD-10-CM | POA: Diagnosis not present

## 2018-01-09 DIAGNOSIS — Z9181 History of falling: Secondary | ICD-10-CM | POA: Diagnosis not present

## 2018-01-09 DIAGNOSIS — J449 Chronic obstructive pulmonary disease, unspecified: Secondary | ICD-10-CM | POA: Diagnosis not present

## 2018-01-09 DIAGNOSIS — I700 Atherosclerosis of aorta: Secondary | ICD-10-CM | POA: Diagnosis not present

## 2018-01-09 DIAGNOSIS — E785 Hyperlipidemia, unspecified: Secondary | ICD-10-CM | POA: Diagnosis not present

## 2018-01-10 DIAGNOSIS — Z7982 Long term (current) use of aspirin: Secondary | ICD-10-CM | POA: Diagnosis not present

## 2018-01-10 DIAGNOSIS — I69354 Hemiplegia and hemiparesis following cerebral infarction affecting left non-dominant side: Secondary | ICD-10-CM | POA: Diagnosis not present

## 2018-01-10 DIAGNOSIS — I129 Hypertensive chronic kidney disease with stage 1 through stage 4 chronic kidney disease, or unspecified chronic kidney disease: Secondary | ICD-10-CM | POA: Diagnosis not present

## 2018-01-10 DIAGNOSIS — N183 Chronic kidney disease, stage 3 (moderate): Secondary | ICD-10-CM | POA: Diagnosis not present

## 2018-01-10 DIAGNOSIS — J449 Chronic obstructive pulmonary disease, unspecified: Secondary | ICD-10-CM | POA: Diagnosis not present

## 2018-01-10 DIAGNOSIS — I700 Atherosclerosis of aorta: Secondary | ICD-10-CM | POA: Diagnosis not present

## 2018-01-10 DIAGNOSIS — Z7901 Long term (current) use of anticoagulants: Secondary | ICD-10-CM | POA: Diagnosis not present

## 2018-01-10 DIAGNOSIS — I714 Abdominal aortic aneurysm, without rupture: Secondary | ICD-10-CM | POA: Diagnosis not present

## 2018-01-14 DIAGNOSIS — J449 Chronic obstructive pulmonary disease, unspecified: Secondary | ICD-10-CM | POA: Diagnosis not present

## 2018-01-14 DIAGNOSIS — I714 Abdominal aortic aneurysm, without rupture: Secondary | ICD-10-CM | POA: Diagnosis not present

## 2018-01-14 DIAGNOSIS — I69354 Hemiplegia and hemiparesis following cerebral infarction affecting left non-dominant side: Secondary | ICD-10-CM | POA: Diagnosis not present

## 2018-01-14 DIAGNOSIS — I700 Atherosclerosis of aorta: Secondary | ICD-10-CM | POA: Diagnosis not present

## 2018-01-14 DIAGNOSIS — Z7982 Long term (current) use of aspirin: Secondary | ICD-10-CM | POA: Diagnosis not present

## 2018-01-14 DIAGNOSIS — I129 Hypertensive chronic kidney disease with stage 1 through stage 4 chronic kidney disease, or unspecified chronic kidney disease: Secondary | ICD-10-CM | POA: Diagnosis not present

## 2018-01-14 DIAGNOSIS — Z7901 Long term (current) use of anticoagulants: Secondary | ICD-10-CM | POA: Diagnosis not present

## 2018-01-14 DIAGNOSIS — N183 Chronic kidney disease, stage 3 (moderate): Secondary | ICD-10-CM | POA: Diagnosis not present

## 2018-01-15 DIAGNOSIS — I69354 Hemiplegia and hemiparesis following cerebral infarction affecting left non-dominant side: Secondary | ICD-10-CM | POA: Diagnosis not present

## 2018-01-15 DIAGNOSIS — I714 Abdominal aortic aneurysm, without rupture: Secondary | ICD-10-CM | POA: Diagnosis not present

## 2018-01-15 DIAGNOSIS — Z7901 Long term (current) use of anticoagulants: Secondary | ICD-10-CM | POA: Diagnosis not present

## 2018-01-15 DIAGNOSIS — I129 Hypertensive chronic kidney disease with stage 1 through stage 4 chronic kidney disease, or unspecified chronic kidney disease: Secondary | ICD-10-CM | POA: Diagnosis not present

## 2018-01-15 DIAGNOSIS — N183 Chronic kidney disease, stage 3 (moderate): Secondary | ICD-10-CM | POA: Diagnosis not present

## 2018-01-15 DIAGNOSIS — Z7982 Long term (current) use of aspirin: Secondary | ICD-10-CM | POA: Diagnosis not present

## 2018-01-15 DIAGNOSIS — I700 Atherosclerosis of aorta: Secondary | ICD-10-CM | POA: Diagnosis not present

## 2018-01-15 DIAGNOSIS — J449 Chronic obstructive pulmonary disease, unspecified: Secondary | ICD-10-CM | POA: Diagnosis not present

## 2018-01-17 DIAGNOSIS — N183 Chronic kidney disease, stage 3 (moderate): Secondary | ICD-10-CM | POA: Diagnosis not present

## 2018-01-17 DIAGNOSIS — J449 Chronic obstructive pulmonary disease, unspecified: Secondary | ICD-10-CM | POA: Diagnosis not present

## 2018-01-17 DIAGNOSIS — I700 Atherosclerosis of aorta: Secondary | ICD-10-CM | POA: Diagnosis not present

## 2018-01-17 DIAGNOSIS — I714 Abdominal aortic aneurysm, without rupture: Secondary | ICD-10-CM | POA: Diagnosis not present

## 2018-01-17 DIAGNOSIS — I69354 Hemiplegia and hemiparesis following cerebral infarction affecting left non-dominant side: Secondary | ICD-10-CM | POA: Diagnosis not present

## 2018-01-17 DIAGNOSIS — Z7901 Long term (current) use of anticoagulants: Secondary | ICD-10-CM | POA: Diagnosis not present

## 2018-01-17 DIAGNOSIS — Z7982 Long term (current) use of aspirin: Secondary | ICD-10-CM | POA: Diagnosis not present

## 2018-01-17 DIAGNOSIS — I129 Hypertensive chronic kidney disease with stage 1 through stage 4 chronic kidney disease, or unspecified chronic kidney disease: Secondary | ICD-10-CM | POA: Diagnosis not present

## 2018-01-21 DIAGNOSIS — I700 Atherosclerosis of aorta: Secondary | ICD-10-CM | POA: Diagnosis not present

## 2018-01-21 DIAGNOSIS — J449 Chronic obstructive pulmonary disease, unspecified: Secondary | ICD-10-CM | POA: Diagnosis not present

## 2018-01-21 DIAGNOSIS — Z7901 Long term (current) use of anticoagulants: Secondary | ICD-10-CM | POA: Diagnosis not present

## 2018-01-21 DIAGNOSIS — I69354 Hemiplegia and hemiparesis following cerebral infarction affecting left non-dominant side: Secondary | ICD-10-CM | POA: Diagnosis not present

## 2018-01-21 DIAGNOSIS — I129 Hypertensive chronic kidney disease with stage 1 through stage 4 chronic kidney disease, or unspecified chronic kidney disease: Secondary | ICD-10-CM | POA: Diagnosis not present

## 2018-01-21 DIAGNOSIS — Z7982 Long term (current) use of aspirin: Secondary | ICD-10-CM | POA: Diagnosis not present

## 2018-01-21 DIAGNOSIS — N183 Chronic kidney disease, stage 3 (moderate): Secondary | ICD-10-CM | POA: Diagnosis not present

## 2018-01-21 DIAGNOSIS — I714 Abdominal aortic aneurysm, without rupture: Secondary | ICD-10-CM | POA: Diagnosis not present

## 2018-01-24 DIAGNOSIS — J449 Chronic obstructive pulmonary disease, unspecified: Secondary | ICD-10-CM | POA: Diagnosis not present

## 2018-01-24 DIAGNOSIS — I129 Hypertensive chronic kidney disease with stage 1 through stage 4 chronic kidney disease, or unspecified chronic kidney disease: Secondary | ICD-10-CM | POA: Diagnosis not present

## 2018-01-24 DIAGNOSIS — N183 Chronic kidney disease, stage 3 (moderate): Secondary | ICD-10-CM | POA: Diagnosis not present

## 2018-01-24 DIAGNOSIS — I700 Atherosclerosis of aorta: Secondary | ICD-10-CM | POA: Diagnosis not present

## 2018-01-24 DIAGNOSIS — I69354 Hemiplegia and hemiparesis following cerebral infarction affecting left non-dominant side: Secondary | ICD-10-CM | POA: Diagnosis not present

## 2018-01-24 DIAGNOSIS — Z7982 Long term (current) use of aspirin: Secondary | ICD-10-CM | POA: Diagnosis not present

## 2018-01-24 DIAGNOSIS — Z7901 Long term (current) use of anticoagulants: Secondary | ICD-10-CM | POA: Diagnosis not present

## 2018-01-24 DIAGNOSIS — I714 Abdominal aortic aneurysm, without rupture: Secondary | ICD-10-CM | POA: Diagnosis not present

## 2018-01-29 DIAGNOSIS — Z7901 Long term (current) use of anticoagulants: Secondary | ICD-10-CM | POA: Diagnosis not present

## 2018-01-29 DIAGNOSIS — Z7982 Long term (current) use of aspirin: Secondary | ICD-10-CM | POA: Diagnosis not present

## 2018-01-29 DIAGNOSIS — I69354 Hemiplegia and hemiparesis following cerebral infarction affecting left non-dominant side: Secondary | ICD-10-CM | POA: Diagnosis not present

## 2018-01-29 DIAGNOSIS — I129 Hypertensive chronic kidney disease with stage 1 through stage 4 chronic kidney disease, or unspecified chronic kidney disease: Secondary | ICD-10-CM | POA: Diagnosis not present

## 2018-01-29 DIAGNOSIS — I700 Atherosclerosis of aorta: Secondary | ICD-10-CM | POA: Diagnosis not present

## 2018-01-29 DIAGNOSIS — I714 Abdominal aortic aneurysm, without rupture: Secondary | ICD-10-CM | POA: Diagnosis not present

## 2018-01-29 DIAGNOSIS — N183 Chronic kidney disease, stage 3 (moderate): Secondary | ICD-10-CM | POA: Diagnosis not present

## 2018-01-29 DIAGNOSIS — J449 Chronic obstructive pulmonary disease, unspecified: Secondary | ICD-10-CM | POA: Diagnosis not present

## 2018-02-04 ENCOUNTER — Ambulatory Visit: Payer: Medicare HMO | Admitting: Neurology

## 2018-02-06 DIAGNOSIS — I129 Hypertensive chronic kidney disease with stage 1 through stage 4 chronic kidney disease, or unspecified chronic kidney disease: Secondary | ICD-10-CM | POA: Diagnosis not present

## 2018-02-06 DIAGNOSIS — I69354 Hemiplegia and hemiparesis following cerebral infarction affecting left non-dominant side: Secondary | ICD-10-CM | POA: Diagnosis not present

## 2018-02-06 DIAGNOSIS — Z7901 Long term (current) use of anticoagulants: Secondary | ICD-10-CM | POA: Diagnosis not present

## 2018-02-06 DIAGNOSIS — N183 Chronic kidney disease, stage 3 (moderate): Secondary | ICD-10-CM | POA: Diagnosis not present

## 2018-02-06 DIAGNOSIS — J449 Chronic obstructive pulmonary disease, unspecified: Secondary | ICD-10-CM | POA: Diagnosis not present

## 2018-02-06 DIAGNOSIS — Z7982 Long term (current) use of aspirin: Secondary | ICD-10-CM | POA: Diagnosis not present

## 2018-02-06 DIAGNOSIS — I714 Abdominal aortic aneurysm, without rupture: Secondary | ICD-10-CM | POA: Diagnosis not present

## 2018-02-06 DIAGNOSIS — I700 Atherosclerosis of aorta: Secondary | ICD-10-CM | POA: Diagnosis not present

## 2018-07-14 DIAGNOSIS — Z6821 Body mass index (BMI) 21.0-21.9, adult: Secondary | ICD-10-CM | POA: Diagnosis not present

## 2018-07-14 DIAGNOSIS — N481 Balanitis: Secondary | ICD-10-CM | POA: Diagnosis not present

## 2018-09-16 DIAGNOSIS — J988 Other specified respiratory disorders: Secondary | ICD-10-CM | POA: Diagnosis not present

## 2018-09-17 DIAGNOSIS — R69 Illness, unspecified: Secondary | ICD-10-CM | POA: Diagnosis not present

## 2018-09-17 DIAGNOSIS — I10 Essential (primary) hypertension: Secondary | ICD-10-CM | POA: Diagnosis not present

## 2018-09-17 DIAGNOSIS — J440 Chronic obstructive pulmonary disease with acute lower respiratory infection: Secondary | ICD-10-CM | POA: Diagnosis not present

## 2018-09-17 DIAGNOSIS — M1990 Unspecified osteoarthritis, unspecified site: Secondary | ICD-10-CM | POA: Diagnosis not present

## 2018-09-17 DIAGNOSIS — Z79899 Other long term (current) drug therapy: Secondary | ICD-10-CM | POA: Diagnosis not present

## 2018-09-17 DIAGNOSIS — J449 Chronic obstructive pulmonary disease, unspecified: Secondary | ICD-10-CM | POA: Diagnosis not present

## 2018-09-17 DIAGNOSIS — R0902 Hypoxemia: Secondary | ICD-10-CM | POA: Diagnosis not present

## 2018-09-17 DIAGNOSIS — F0390 Unspecified dementia without behavioral disturbance: Secondary | ICD-10-CM

## 2018-09-17 DIAGNOSIS — N179 Acute kidney failure, unspecified: Secondary | ICD-10-CM | POA: Diagnosis not present

## 2018-09-17 DIAGNOSIS — M109 Gout, unspecified: Secondary | ICD-10-CM | POA: Diagnosis not present

## 2018-09-17 DIAGNOSIS — R079 Chest pain, unspecified: Secondary | ICD-10-CM | POA: Diagnosis not present

## 2018-09-17 DIAGNOSIS — Z87891 Personal history of nicotine dependence: Secondary | ICD-10-CM | POA: Diagnosis not present

## 2018-09-17 DIAGNOSIS — Z23 Encounter for immunization: Secondary | ICD-10-CM | POA: Diagnosis not present

## 2018-09-17 DIAGNOSIS — J189 Pneumonia, unspecified organism: Secondary | ICD-10-CM | POA: Diagnosis not present

## 2018-09-17 DIAGNOSIS — R05 Cough: Secondary | ICD-10-CM | POA: Diagnosis not present

## 2018-09-17 DIAGNOSIS — R0602 Shortness of breath: Secondary | ICD-10-CM | POA: Diagnosis not present

## 2018-09-17 DIAGNOSIS — J9601 Acute respiratory failure with hypoxia: Secondary | ICD-10-CM | POA: Diagnosis not present

## 2018-09-18 DIAGNOSIS — I10 Essential (primary) hypertension: Secondary | ICD-10-CM | POA: Diagnosis not present

## 2018-09-18 DIAGNOSIS — Z87891 Personal history of nicotine dependence: Secondary | ICD-10-CM | POA: Diagnosis not present

## 2018-09-18 DIAGNOSIS — F0390 Unspecified dementia without behavioral disturbance: Secondary | ICD-10-CM | POA: Diagnosis not present

## 2018-09-18 DIAGNOSIS — J9601 Acute respiratory failure with hypoxia: Secondary | ICD-10-CM | POA: Diagnosis not present

## 2018-09-18 DIAGNOSIS — J189 Pneumonia, unspecified organism: Secondary | ICD-10-CM | POA: Diagnosis not present

## 2018-09-18 DIAGNOSIS — N179 Acute kidney failure, unspecified: Secondary | ICD-10-CM | POA: Diagnosis not present

## 2018-09-18 DIAGNOSIS — J449 Chronic obstructive pulmonary disease, unspecified: Secondary | ICD-10-CM | POA: Diagnosis not present

## 2018-09-18 DIAGNOSIS — R69 Illness, unspecified: Secondary | ICD-10-CM | POA: Diagnosis not present

## 2018-09-19 DIAGNOSIS — Z87891 Personal history of nicotine dependence: Secondary | ICD-10-CM | POA: Diagnosis not present

## 2018-09-19 DIAGNOSIS — F0390 Unspecified dementia without behavioral disturbance: Secondary | ICD-10-CM | POA: Diagnosis not present

## 2018-09-19 DIAGNOSIS — J189 Pneumonia, unspecified organism: Secondary | ICD-10-CM | POA: Diagnosis not present

## 2018-09-19 DIAGNOSIS — J9601 Acute respiratory failure with hypoxia: Secondary | ICD-10-CM | POA: Diagnosis not present

## 2018-09-19 DIAGNOSIS — J449 Chronic obstructive pulmonary disease, unspecified: Secondary | ICD-10-CM | POA: Diagnosis not present

## 2018-09-19 DIAGNOSIS — N179 Acute kidney failure, unspecified: Secondary | ICD-10-CM | POA: Diagnosis not present

## 2018-09-19 DIAGNOSIS — I10 Essential (primary) hypertension: Secondary | ICD-10-CM | POA: Diagnosis not present

## 2018-09-19 DIAGNOSIS — R69 Illness, unspecified: Secondary | ICD-10-CM | POA: Diagnosis not present

## 2018-09-20 DIAGNOSIS — J449 Chronic obstructive pulmonary disease, unspecified: Secondary | ICD-10-CM | POA: Diagnosis not present

## 2018-09-20 DIAGNOSIS — R69 Illness, unspecified: Secondary | ICD-10-CM | POA: Diagnosis not present

## 2018-09-20 DIAGNOSIS — F0390 Unspecified dementia without behavioral disturbance: Secondary | ICD-10-CM | POA: Diagnosis not present

## 2018-09-20 DIAGNOSIS — Z87891 Personal history of nicotine dependence: Secondary | ICD-10-CM

## 2018-09-20 DIAGNOSIS — J9601 Acute respiratory failure with hypoxia: Secondary | ICD-10-CM | POA: Diagnosis not present

## 2018-09-20 DIAGNOSIS — J189 Pneumonia, unspecified organism: Secondary | ICD-10-CM | POA: Diagnosis not present

## 2018-09-20 DIAGNOSIS — I10 Essential (primary) hypertension: Secondary | ICD-10-CM | POA: Diagnosis not present

## 2018-09-20 DIAGNOSIS — N179 Acute kidney failure, unspecified: Secondary | ICD-10-CM

## 2018-09-21 DIAGNOSIS — R69 Illness, unspecified: Secondary | ICD-10-CM | POA: Diagnosis not present

## 2018-09-25 DIAGNOSIS — N183 Chronic kidney disease, stage 3 (moderate): Secondary | ICD-10-CM | POA: Diagnosis not present

## 2018-09-25 DIAGNOSIS — J9611 Chronic respiratory failure with hypoxia: Secondary | ICD-10-CM | POA: Diagnosis not present

## 2018-09-25 DIAGNOSIS — J449 Chronic obstructive pulmonary disease, unspecified: Secondary | ICD-10-CM | POA: Diagnosis not present

## 2018-09-25 DIAGNOSIS — Z6821 Body mass index (BMI) 21.0-21.9, adult: Secondary | ICD-10-CM | POA: Diagnosis not present

## 2018-09-25 DIAGNOSIS — I10 Essential (primary) hypertension: Secondary | ICD-10-CM | POA: Diagnosis not present

## 2018-09-25 DIAGNOSIS — Z9981 Dependence on supplemental oxygen: Secondary | ICD-10-CM | POA: Diagnosis not present

## 2018-09-25 DIAGNOSIS — J189 Pneumonia, unspecified organism: Secondary | ICD-10-CM | POA: Diagnosis not present

## 2018-09-25 DIAGNOSIS — Z8673 Personal history of transient ischemic attack (TIA), and cerebral infarction without residual deficits: Secondary | ICD-10-CM | POA: Diagnosis not present

## 2018-09-25 DIAGNOSIS — I700 Atherosclerosis of aorta: Secondary | ICD-10-CM | POA: Diagnosis not present

## 2019-01-18 DIAGNOSIS — J189 Pneumonia, unspecified organism: Secondary | ICD-10-CM | POA: Diagnosis not present

## 2019-01-18 DIAGNOSIS — B9729 Other coronavirus as the cause of diseases classified elsewhere: Secondary | ICD-10-CM | POA: Diagnosis not present

## 2019-01-18 DIAGNOSIS — Z79899 Other long term (current) drug therapy: Secondary | ICD-10-CM | POA: Diagnosis not present

## 2019-01-18 DIAGNOSIS — I10 Essential (primary) hypertension: Secondary | ICD-10-CM | POA: Diagnosis not present

## 2019-01-18 DIAGNOSIS — U071 COVID-19: Secondary | ICD-10-CM | POA: Insufficient documentation

## 2019-01-18 DIAGNOSIS — J9601 Acute respiratory failure with hypoxia: Secondary | ICD-10-CM | POA: Diagnosis not present

## 2019-01-18 DIAGNOSIS — R0902 Hypoxemia: Secondary | ICD-10-CM | POA: Diagnosis not present

## 2019-01-18 DIAGNOSIS — J440 Chronic obstructive pulmonary disease with acute lower respiratory infection: Secondary | ICD-10-CM | POA: Diagnosis not present

## 2019-01-18 DIAGNOSIS — J9690 Respiratory failure, unspecified, unspecified whether with hypoxia or hypercapnia: Secondary | ICD-10-CM | POA: Diagnosis not present

## 2019-01-18 DIAGNOSIS — R0602 Shortness of breath: Secondary | ICD-10-CM | POA: Diagnosis not present

## 2019-01-18 DIAGNOSIS — J449 Chronic obstructive pulmonary disease, unspecified: Secondary | ICD-10-CM | POA: Diagnosis not present

## 2019-01-18 DIAGNOSIS — N179 Acute kidney failure, unspecified: Secondary | ICD-10-CM | POA: Diagnosis not present

## 2019-01-18 DIAGNOSIS — J1289 Other viral pneumonia: Secondary | ICD-10-CM | POA: Diagnosis not present

## 2019-01-18 HISTORY — DX: COVID-19: U07.1

## 2019-03-25 DIAGNOSIS — Z0000 Encounter for general adult medical examination without abnormal findings: Secondary | ICD-10-CM | POA: Diagnosis not present

## 2019-03-25 DIAGNOSIS — Z8673 Personal history of transient ischemic attack (TIA), and cerebral infarction without residual deficits: Secondary | ICD-10-CM | POA: Diagnosis not present

## 2019-03-25 DIAGNOSIS — E785 Hyperlipidemia, unspecified: Secondary | ICD-10-CM | POA: Diagnosis not present

## 2019-03-25 DIAGNOSIS — I700 Atherosclerosis of aorta: Secondary | ICD-10-CM | POA: Diagnosis not present

## 2019-03-25 DIAGNOSIS — I714 Abdominal aortic aneurysm, without rupture: Secondary | ICD-10-CM | POA: Diagnosis not present

## 2019-03-25 DIAGNOSIS — J449 Chronic obstructive pulmonary disease, unspecified: Secondary | ICD-10-CM | POA: Diagnosis not present

## 2019-03-25 DIAGNOSIS — I10 Essential (primary) hypertension: Secondary | ICD-10-CM | POA: Diagnosis not present

## 2019-03-25 DIAGNOSIS — T466X5A Adverse effect of antihyperlipidemic and antiarteriosclerotic drugs, initial encounter: Secondary | ICD-10-CM | POA: Diagnosis not present

## 2019-03-25 DIAGNOSIS — R739 Hyperglycemia, unspecified: Secondary | ICD-10-CM | POA: Diagnosis not present

## 2019-03-25 DIAGNOSIS — M791 Myalgia, unspecified site: Secondary | ICD-10-CM | POA: Diagnosis not present

## 2019-06-01 DIAGNOSIS — R0981 Nasal congestion: Secondary | ICD-10-CM | POA: Diagnosis not present

## 2019-06-01 DIAGNOSIS — J0100 Acute maxillary sinusitis, unspecified: Secondary | ICD-10-CM | POA: Diagnosis not present

## 2019-07-27 DIAGNOSIS — M67431 Ganglion, right wrist: Secondary | ICD-10-CM | POA: Diagnosis not present

## 2019-08-03 DIAGNOSIS — M67431 Ganglion, right wrist: Secondary | ICD-10-CM | POA: Diagnosis not present

## 2019-08-07 DIAGNOSIS — L0390 Cellulitis, unspecified: Secondary | ICD-10-CM | POA: Diagnosis not present

## 2019-08-09 DIAGNOSIS — L03115 Cellulitis of right lower limb: Secondary | ICD-10-CM | POA: Diagnosis not present

## 2019-08-09 DIAGNOSIS — L0390 Cellulitis, unspecified: Secondary | ICD-10-CM | POA: Diagnosis not present

## 2019-08-10 DIAGNOSIS — L03115 Cellulitis of right lower limb: Secondary | ICD-10-CM | POA: Diagnosis not present

## 2019-08-10 DIAGNOSIS — L089 Local infection of the skin and subcutaneous tissue, unspecified: Secondary | ICD-10-CM | POA: Diagnosis not present

## 2019-08-10 DIAGNOSIS — R0902 Hypoxemia: Secondary | ICD-10-CM | POA: Diagnosis not present

## 2019-08-10 DIAGNOSIS — R069 Unspecified abnormalities of breathing: Secondary | ICD-10-CM | POA: Diagnosis not present

## 2019-10-07 DIAGNOSIS — J219 Acute bronchiolitis, unspecified: Secondary | ICD-10-CM | POA: Diagnosis not present

## 2019-10-07 DIAGNOSIS — I129 Hypertensive chronic kidney disease with stage 1 through stage 4 chronic kidney disease, or unspecified chronic kidney disease: Secondary | ICD-10-CM | POA: Diagnosis not present

## 2019-10-07 DIAGNOSIS — R05 Cough: Secondary | ICD-10-CM | POA: Diagnosis not present

## 2019-10-07 DIAGNOSIS — N189 Chronic kidney disease, unspecified: Secondary | ICD-10-CM | POA: Diagnosis not present

## 2019-10-07 DIAGNOSIS — R69 Illness, unspecified: Secondary | ICD-10-CM | POA: Diagnosis not present

## 2019-10-07 DIAGNOSIS — J441 Chronic obstructive pulmonary disease with (acute) exacerbation: Secondary | ICD-10-CM | POA: Diagnosis not present

## 2019-10-07 DIAGNOSIS — J439 Emphysema, unspecified: Secondary | ICD-10-CM | POA: Diagnosis not present

## 2019-10-07 DIAGNOSIS — R0902 Hypoxemia: Secondary | ICD-10-CM | POA: Diagnosis not present

## 2019-10-21 DIAGNOSIS — I951 Orthostatic hypotension: Secondary | ICD-10-CM | POA: Diagnosis not present

## 2019-10-21 DIAGNOSIS — R42 Dizziness and giddiness: Secondary | ICD-10-CM | POA: Diagnosis not present

## 2019-10-21 DIAGNOSIS — Z6820 Body mass index (BMI) 20.0-20.9, adult: Secondary | ICD-10-CM | POA: Diagnosis not present

## 2019-10-21 DIAGNOSIS — Z79899 Other long term (current) drug therapy: Secondary | ICD-10-CM | POA: Diagnosis not present

## 2019-10-21 DIAGNOSIS — E785 Hyperlipidemia, unspecified: Secondary | ICD-10-CM | POA: Diagnosis not present

## 2019-10-29 DIAGNOSIS — Z1211 Encounter for screening for malignant neoplasm of colon: Secondary | ICD-10-CM | POA: Diagnosis not present

## 2019-11-11 ENCOUNTER — Emergency Department (HOSPITAL_COMMUNITY): Payer: Medicare HMO | Admitting: Certified Registered Nurse Anesthetist

## 2019-11-11 ENCOUNTER — Emergency Department (HOSPITAL_COMMUNITY): Payer: Medicare HMO

## 2019-11-11 ENCOUNTER — Inpatient Hospital Stay (HOSPITAL_COMMUNITY): Payer: Medicare HMO

## 2019-11-11 ENCOUNTER — Encounter (HOSPITAL_COMMUNITY)
Admission: EM | Disposition: A | Discharge status: Home Health | Payer: Self-pay | Source: Other Acute Inpatient Hospital | Attending: Neurology

## 2019-11-11 ENCOUNTER — Inpatient Hospital Stay (HOSPITAL_COMMUNITY)
Admission: EM | Admit: 2019-11-11 | Discharge: 2019-11-13 | DRG: 035 | Disposition: A | Discharge status: Home Health | Payer: Medicare HMO | Source: Other Acute Inpatient Hospital | Attending: Neurology | Admitting: Neurology

## 2019-11-11 DIAGNOSIS — R29810 Facial weakness: Secondary | ICD-10-CM | POA: Diagnosis not present

## 2019-11-11 DIAGNOSIS — E7800 Pure hypercholesterolemia, unspecified: Secondary | ICD-10-CM | POA: Diagnosis not present

## 2019-11-11 DIAGNOSIS — D62 Acute posthemorrhagic anemia: Secondary | ICD-10-CM | POA: Diagnosis not present

## 2019-11-11 DIAGNOSIS — Z20822 Contact with and (suspected) exposure to covid-19: Secondary | ICD-10-CM | POA: Diagnosis not present

## 2019-11-11 DIAGNOSIS — R414 Neurologic neglect syndrome: Secondary | ICD-10-CM | POA: Diagnosis not present

## 2019-11-11 DIAGNOSIS — I6521 Occlusion and stenosis of right carotid artery: Secondary | ICD-10-CM | POA: Diagnosis not present

## 2019-11-11 DIAGNOSIS — R4781 Slurred speech: Secondary | ICD-10-CM | POA: Diagnosis not present

## 2019-11-11 DIAGNOSIS — J449 Chronic obstructive pulmonary disease, unspecified: Secondary | ICD-10-CM | POA: Diagnosis present

## 2019-11-11 DIAGNOSIS — R269 Unspecified abnormalities of gait and mobility: Secondary | ICD-10-CM | POA: Diagnosis not present

## 2019-11-11 DIAGNOSIS — H9190 Unspecified hearing loss, unspecified ear: Secondary | ICD-10-CM | POA: Diagnosis present

## 2019-11-11 DIAGNOSIS — Z79899 Other long term (current) drug therapy: Secondary | ICD-10-CM | POA: Diagnosis not present

## 2019-11-11 DIAGNOSIS — I6529 Occlusion and stenosis of unspecified carotid artery: Secondary | ICD-10-CM | POA: Diagnosis not present

## 2019-11-11 DIAGNOSIS — I10 Essential (primary) hypertension: Secondary | ICD-10-CM | POA: Diagnosis not present

## 2019-11-11 DIAGNOSIS — Z823 Family history of stroke: Secondary | ICD-10-CM

## 2019-11-11 DIAGNOSIS — M1990 Unspecified osteoarthritis, unspecified site: Secondary | ICD-10-CM | POA: Diagnosis not present

## 2019-11-11 DIAGNOSIS — R29707 NIHSS score 7: Secondary | ICD-10-CM | POA: Diagnosis present

## 2019-11-11 DIAGNOSIS — G8194 Hemiplegia, unspecified affecting left nondominant side: Secondary | ICD-10-CM | POA: Diagnosis not present

## 2019-11-11 DIAGNOSIS — Z7952 Long term (current) use of systemic steroids: Secondary | ICD-10-CM | POA: Diagnosis not present

## 2019-11-11 DIAGNOSIS — Z9282 Status post administration of tPA (rtPA) in a different facility within the last 24 hours prior to admission to current facility: Secondary | ICD-10-CM

## 2019-11-11 DIAGNOSIS — E785 Hyperlipidemia, unspecified: Secondary | ICD-10-CM | POA: Diagnosis present

## 2019-11-11 DIAGNOSIS — I63311 Cerebral infarction due to thrombosis of right middle cerebral artery: Secondary | ICD-10-CM | POA: Diagnosis not present

## 2019-11-11 DIAGNOSIS — R29705 NIHSS score 5: Secondary | ICD-10-CM | POA: Diagnosis not present

## 2019-11-11 DIAGNOSIS — I63231 Cerebral infarction due to unspecified occlusion or stenosis of right carotid arteries: Secondary | ICD-10-CM

## 2019-11-11 DIAGNOSIS — N390 Urinary tract infection, site not specified: Secondary | ICD-10-CM | POA: Diagnosis not present

## 2019-11-11 DIAGNOSIS — I6389 Other cerebral infarction: Secondary | ICD-10-CM | POA: Diagnosis not present

## 2019-11-11 DIAGNOSIS — I639 Cerebral infarction, unspecified: Secondary | ICD-10-CM

## 2019-11-11 DIAGNOSIS — I63511 Cerebral infarction due to unspecified occlusion or stenosis of right middle cerebral artery: Secondary | ICD-10-CM | POA: Diagnosis not present

## 2019-11-11 DIAGNOSIS — I959 Hypotension, unspecified: Secondary | ICD-10-CM | POA: Diagnosis not present

## 2019-11-11 DIAGNOSIS — I6601 Occlusion and stenosis of right middle cerebral artery: Secondary | ICD-10-CM | POA: Diagnosis not present

## 2019-11-11 DIAGNOSIS — R531 Weakness: Secondary | ICD-10-CM | POA: Diagnosis not present

## 2019-11-11 DIAGNOSIS — I6501 Occlusion and stenosis of right vertebral artery: Secondary | ICD-10-CM | POA: Diagnosis not present

## 2019-11-11 DIAGNOSIS — I6523 Occlusion and stenosis of bilateral carotid arteries: Secondary | ICD-10-CM | POA: Diagnosis not present

## 2019-11-11 HISTORY — DX: Cerebral infarction due to unspecified occlusion or stenosis of right carotid arteries: I63.231

## 2019-11-11 HISTORY — PX: IR INTRAVSC STENT CERV CAROTID W/O EMB-PROT MOD SED INC ANGIO: IMG2304

## 2019-11-11 HISTORY — PX: RADIOLOGY WITH ANESTHESIA: SHX6223

## 2019-11-11 HISTORY — PX: IR PERCUTANEOUS ART THROMBECTOMY/INFUSION INTRACRANIAL INC DIAG ANGIO: IMG6087

## 2019-11-11 LAB — COMPREHENSIVE METABOLIC PANEL
ALT: 11 U/L (ref 0–44)
AST: 14 U/L — ABNORMAL LOW (ref 15–41)
Albumin: 2.8 g/dL — ABNORMAL LOW (ref 3.5–5.0)
Alkaline Phosphatase: 64 U/L (ref 38–126)
Anion gap: 10 (ref 5–15)
BUN: 19 mg/dL (ref 8–23)
CO2: 21 mmol/L — ABNORMAL LOW (ref 22–32)
Calcium: 8.1 mg/dL — ABNORMAL LOW (ref 8.9–10.3)
Chloride: 106 mmol/L (ref 98–111)
Creatinine, Ser: 1.1 mg/dL (ref 0.61–1.24)
GFR calc Af Amer: >60 mL/min (ref >60)
GFR calc non Af Amer: >60 mL/min (ref >60)
Glucose, Bld: 139 mg/dL — ABNORMAL HIGH (ref 70–99)
Potassium: 4 mmol/L (ref 3.5–5.1)
Sodium: 137 mmol/L (ref 135–145)
Total Bilirubin: 0.4 mg/dL (ref 0.3–1.2)
Total Protein: 5.9 g/dL — ABNORMAL LOW (ref 6.5–8.1)

## 2019-11-11 LAB — CBC
HCT: 32.1 % — ABNORMAL LOW (ref 39.0–52.0)
Hemoglobin: 10.1 g/dL — ABNORMAL LOW (ref 13.0–17.0)
MCH: 31.2 pg (ref 26.0–34.0)
MCHC: 31.5 g/dL (ref 30.0–36.0)
MCV: 99.1 fL (ref 80.0–100.0)
Platelets: 187 10*3/uL (ref 150–400)
RBC: 3.24 MIL/uL — ABNORMAL LOW (ref 4.22–5.81)
RDW: 14.3 % (ref 11.5–15.5)
WBC: 7.1 10*3/uL (ref 4.0–10.5)
nRBC: 0 % (ref 0.0–0.2)

## 2019-11-11 LAB — MRSA PCR SCREENING: MRSA by PCR: POSITIVE — AB

## 2019-11-11 LAB — SARS CORONAVIRUS 2 BY RT PCR (HOSPITAL ORDER, PERFORMED IN ~~LOC~~ HOSPITAL LAB): SARS Coronavirus 2: NEGATIVE

## 2019-11-11 SURGERY — IR WITH ANESTHESIA
Anesthesia: General

## 2019-11-11 MED ORDER — ONDANSETRON HCL 4 MG/2ML IJ SOLN
INTRAMUSCULAR | Status: DC | PRN
  Administered 2019-11-11: 4 mg via INTRAVENOUS

## 2019-11-11 MED ORDER — PROPOFOL 10 MG/ML IV BOLUS
INTRAVENOUS | Status: DC | PRN
  Administered 2019-11-11: 60 mg via INTRAVENOUS
  Administered 2019-11-11: 30 mg via INTRAVENOUS

## 2019-11-11 MED ORDER — CLOPIDOGREL BISULFATE 300 MG PO TABS
ORAL_TABLET | ORAL | Status: AC
  Filled 2019-11-11: qty 1

## 2019-11-11 MED ORDER — TICAGRELOR 60 MG PO TABS
ORAL_TABLET | ORAL | Status: AC | PRN
  Administered 2019-11-11: 180 mg

## 2019-11-11 MED ORDER — IOHEXOL 240 MG/ML SOLN
INTRAMUSCULAR | Status: AC
  Filled 2019-11-11: qty 200

## 2019-11-11 MED ORDER — CEFAZOLIN SODIUM-DEXTROSE 2-3 GM-%(50ML) IV SOLR
INTRAVENOUS | Status: DC | PRN
  Administered 2019-11-11: 2 g via INTRAVENOUS

## 2019-11-11 MED ORDER — TICAGRELOR 90 MG PO TABS
90.0000 mg | ORAL_TABLET | Freq: Two times a day (BID) | ORAL | Status: DC
  Administered 2019-11-12 – 2019-11-13 (×3): 90 mg via ORAL
  Filled 2019-11-11 (×5): qty 1

## 2019-11-11 MED ORDER — TIROFIBAN HCL IN NACL 5-0.9 MG/100ML-% IV SOLN
INTRAVENOUS | Status: AC
  Filled 2019-11-11: qty 100

## 2019-11-11 MED ORDER — CANGRELOR TETRASODIUM 50 MG IV SOLR
INTRAVENOUS | Status: AC
  Filled 2019-11-11: qty 50

## 2019-11-11 MED ORDER — SODIUM CHLORIDE 0.9 % IV SOLN
INTRAVENOUS | Status: AC | PRN
  Administered 2019-11-11: 2 ug/kg/min via INTRAVENOUS

## 2019-11-11 MED ORDER — CLEVIDIPINE BUTYRATE 0.5 MG/ML IV EMUL
0.0000 mg/h | INTRAVENOUS | Status: AC
  Administered 2019-11-11: 4 mg/h via INTRAVENOUS
  Administered 2019-11-12: 10 mg/h via INTRAVENOUS
  Administered 2019-11-12 (×2): 20 mg/h via INTRAVENOUS
  Administered 2019-11-12: 10 mg/h via INTRAVENOUS
  Filled 2019-11-11 (×7): qty 50

## 2019-11-11 MED ORDER — SUCCINYLCHOLINE CHLORIDE 20 MG/ML IJ SOLN
INTRAMUSCULAR | Status: DC | PRN
  Administered 2019-11-11: 120 mg via INTRAVENOUS

## 2019-11-11 MED ORDER — SODIUM CHLORIDE 0.9 % IV SOLN: 50.0000 mL/h | INTRAVENOUS | Status: DC

## 2019-11-11 MED ORDER — CHLORHEXIDINE GLUCONATE CLOTH 2 % EX PADS
6.0000 | MEDICATED_PAD | Freq: Every day | CUTANEOUS | Status: DC
  Administered 2019-11-12: 6 via TOPICAL

## 2019-11-11 MED ORDER — ACETAMINOPHEN 650 MG RE SUPP: 650.0000 mg | RECTAL | Status: DC | PRN

## 2019-11-11 MED ORDER — ACETAMINOPHEN 160 MG/5ML PO SOLN: 650.0000 mg | ORAL | Status: DC | PRN

## 2019-11-11 MED ORDER — STROKE: EARLY STAGES OF RECOVERY BOOK
Freq: Once | Status: DC
  Filled 2019-11-11: qty 1

## 2019-11-11 MED ORDER — FENTANYL CITRATE (PF) 250 MCG/5ML IJ SOLN
INTRAMUSCULAR | Status: DC | PRN
  Administered 2019-11-11: 100 ug via INTRAVENOUS

## 2019-11-11 MED ORDER — ASPIRIN 81 MG PO CHEW
CHEWABLE_TABLET | ORAL | Status: AC | PRN
  Administered 2019-11-11: 81 mg

## 2019-11-11 MED ORDER — ASPIRIN 81 MG PO CHEW: 81.0000 mg | CHEWABLE_TABLET | Freq: Every day | ORAL | Status: DC

## 2019-11-11 MED ORDER — ROCURONIUM BROMIDE 100 MG/10ML IV SOLN
INTRAVENOUS | Status: DC | PRN
  Administered 2019-11-11: 80 mg via INTRAVENOUS

## 2019-11-11 MED ORDER — GLYCOPYRROLATE 0.2 MG/ML IJ SOLN
INTRAMUSCULAR | Status: DC | PRN
Start: 2019-11-11 — End: 2019-11-11
  Administered 2019-11-11: .2 mg via INTRAVENOUS

## 2019-11-11 MED ORDER — IOHEXOL 350 MG/ML SOLN
40.0000 mL | Freq: Once | INTRAVENOUS | Status: AC | PRN
  Administered 2019-11-11: 40 mL via INTRAVENOUS

## 2019-11-11 MED ORDER — PANTOPRAZOLE SODIUM 40 MG IV SOLR
40.0000 mg | Freq: Every day | INTRAVENOUS | Status: DC
  Administered 2019-11-11: 40 mg via INTRAVENOUS
  Filled 2019-11-11: qty 40

## 2019-11-11 MED ORDER — TICAGRELOR 90 MG PO TABS: 90.0000 mg | ORAL_TABLET | Freq: Two times a day (BID) | ORAL | Status: DC

## 2019-11-11 MED ORDER — ASPIRIN 81 MG PO CHEW
81.0000 mg | CHEWABLE_TABLET | Freq: Every day | ORAL | Status: DC
  Administered 2019-11-12 – 2019-11-13 (×2): 81 mg via ORAL
  Filled 2019-11-11 (×2): qty 1

## 2019-11-11 MED ORDER — CEFAZOLIN SODIUM-DEXTROSE 2-4 GM/100ML-% IV SOLN
INTRAVENOUS | Status: AC
  Filled 2019-11-11: qty 100

## 2019-11-11 MED ORDER — EPTIFIBATIDE 20 MG/10ML IV SOLN
INTRAVENOUS | Status: AC
  Filled 2019-11-11: qty 10

## 2019-11-11 MED ORDER — PHENYLEPHRINE HCL-NACL 10-0.9 MG/250ML-% IV SOLN
INTRAVENOUS | Status: DC | PRN
  Administered 2019-11-11: 10 ug/min via INTRAVENOUS

## 2019-11-11 MED ORDER — NICARDIPINE HCL IN NACL 20-0.86 MG/200ML-% IV SOLN: 0.0000 mg/h | INTRAVENOUS | Status: DC | PRN

## 2019-11-11 MED ORDER — ACETAMINOPHEN 325 MG PO TABS: 650.0000 mg | ORAL_TABLET | ORAL | Status: DC | PRN

## 2019-11-11 MED ORDER — EPHEDRINE SULFATE 50 MG/ML IJ SOLN
INTRAMUSCULAR | Status: DC | PRN
Start: 2019-11-11 — End: 2019-11-11
  Administered 2019-11-11 (×3): 10 mg via INTRAVENOUS

## 2019-11-11 MED ORDER — MUPIROCIN 2 % EX OINT
1.0000 "application " | TOPICAL_OINTMENT | Freq: Two times a day (BID) | CUTANEOUS | Status: DC
  Administered 2019-11-11 – 2019-11-13 (×3): 1 via NASAL
  Filled 2019-11-11 (×2): qty 22

## 2019-11-11 MED ORDER — TICAGRELOR 90 MG PO TABS
ORAL_TABLET | ORAL | Status: AC
  Administered 2019-11-11: 90 mg via ORAL
  Filled 2019-11-11: qty 2

## 2019-11-11 MED ORDER — NITROGLYCERIN 1 MG/10 ML FOR IR/CATH LAB
INTRA_ARTERIAL | Status: AC
  Filled 2019-11-11: qty 10

## 2019-11-11 MED ORDER — ASPIRIN 81 MG PO CHEW
CHEWABLE_TABLET | ORAL | Status: AC
  Filled 2019-11-11: qty 1

## 2019-11-11 MED ORDER — SUGAMMADEX SODIUM 200 MG/2ML IV SOLN
INTRAVENOUS | Status: DC | PRN
  Administered 2019-11-11: 200 mg via INTRAVENOUS

## 2019-11-11 MED ORDER — IOHEXOL 300 MG/ML  SOLN
150.0000 mL | Freq: Once | INTRAMUSCULAR | Status: AC | PRN
  Administered 2019-11-11: 60 mL via INTRA_ARTERIAL

## 2019-11-11 MED ORDER — LABETALOL HCL 5 MG/ML IV SOLN
10.0000 mg | Freq: Once | INTRAVENOUS | Status: DC | PRN
  Filled 2019-11-11: qty 4

## 2019-11-11 MED ORDER — SODIUM CHLORIDE 0.9 % IV SOLN: INTRAVENOUS | Status: DC

## 2019-11-11 MED ORDER — CHLORHEXIDINE GLUCONATE CLOTH 2 % EX PADS
6.0000 | MEDICATED_PAD | Freq: Every day | CUTANEOUS | Status: DC
  Administered 2019-11-11: 6 via TOPICAL

## 2019-11-11 MED ORDER — SODIUM CHLORIDE 0.9 % IV SOLN: INTRAVENOUS | Status: DC | PRN

## 2019-11-11 MED ORDER — PROPOFOL 500 MG/50ML IV EMUL
INTRAVENOUS | Status: DC | PRN
  Administered 2019-11-11: 100 ug/kg/min via INTRAVENOUS

## 2019-11-11 MED ORDER — CANGRELOR BOLUS VIA INFUSION
INTRAVENOUS | Status: AC | PRN
  Administered 2019-11-11: 1045.5 ug via INTRAVENOUS

## 2019-11-11 MED ORDER — VERAPAMIL HCL 2.5 MG/ML IV SOLN
INTRAVENOUS | Status: AC
  Filled 2019-11-11: qty 2

## 2019-11-11 MED ORDER — DEXAMETHASONE SODIUM PHOSPHATE 10 MG/ML IJ SOLN
INTRAMUSCULAR | Status: DC | PRN
Start: 2019-11-11 — End: 2019-11-11
  Administered 2019-11-11: 5 mg via INTRAVENOUS

## 2019-11-11 NOTE — Progress Notes (Signed)
CRITICAL VALUE ALERT  Critical Value:  MRSA pos  Date & Time Notied:  11/11/19 1910  Provider Notified:  Orders Received/Actions taken: MRSA positive standing orders initiated.

## 2019-11-11 NOTE — Anesthesia Procedure Notes (Signed)
Arterial Line Insertion Performed by: Wilburn Cornelia, CRNA, CRNA  Patient location: Pre-op. Preanesthetic checklist: patient identified, IV checked, site marked, risks and benefits discussed, surgical consent, monitors and equipment checked, pre-op evaluation, timeout performed and anesthesia consent Lidocaine 1% used for infiltration Left, radial was placed Catheter size: 20 Fr Hand hygiene performed  and maximum sterile barriers used   Attempts: 1 Procedure performed without using ultrasound guided technique. Following insertion, dressing applied and Biopatch. Post procedure assessment: normal and unchanged  Patient tolerated the procedure well with no immediate complications.

## 2019-11-11 NOTE — Progress Notes (Addendum)
Patient transported to PACU by CRNA, RT, and this RN. Report given to receiving PACU RN. On arrival to PACU unit, patient left upper arm has firm ecchymosis present. Dr. Estanislado Pandy made aware of left arm and at bedside to assess area.  Left upper arm ecchymosis area marked by Dr. Estanislado Pandy with skin marker. Left upper arm circumference measured: 11.5 inches @ 1330. Left upper arm wrapped with compression dressing by physician. Left arm warm to touch and radial pulse present.

## 2019-11-11 NOTE — ED Provider Notes (Signed)
[place] EMERGENCY DEPARTMENT Provider Note   CSN: [id number] Arrival date & time: 11/11/19  1023  An emergency department physician performed an initial assessment on this suspected stroke patient at 84.  History No chief complaint on file.   Keion Neels is a 84 y.o. male.  Hx limited due to acuity.    Presented as a stroke alert.  Went to [place] ER with complaints of left-sided weakness, last known well at 39 AM.  At [facility], CTA showing occluded right carotid.  They do not have CT perfusion.  Rebecca Eaton and sent to [facility] ER for neuro, neuro IR, CT perfusion.  On arrival, patient continues to have left-sided weakness.  No additional complaints.     HPI     Past Medical History:  Diagnosis Date  . COPD (chronic obstructive pulmonary disease) ([place])   . Hypertension     Patient Active Problem List   Diagnosis Date Noted  . Stroke (cerebrum) ([place]) 11/11/2019  . Cerebral infarction due to occlusion of right carotid artery ([place]) 11/11/2019    Past Surgical History:  Procedure Laterality Date  . ESOPHAGOGASTRODUODENOSCOPY N/A 07/29/2016   Procedure: ESOPHAGOGASTRODUODENOSCOPY (EGD);  Surgeon: Juanita Craver, MD;  Location: [facility] ENDOSCOPY;  Service: Endoscopy;  Laterality: N/A;  . RADIOLOGY WITH ANESTHESIA N/A 11/11/2019   Procedure: IR WITH ANESTHESIA - CODE STROKE;  Surgeon: Luanne Bras, MD;  Location: [place];  Service: Radiology;  Laterality: N/A;       No family history on file.  Social History   Tobacco Use  . Smoking status: Never Smoker  . Smokeless tobacco: Never Used  Substance Use Topics  . Alcohol use: No  . Drug use: No    Home Medications Prior to Admission medications   Medication Sig Start Date End Date Taking? Authorizing Provider  amLODipine (NORVASC) 10 MG tablet Take 10 mg by mouth daily. 10/09/19  Yes [provider]  Aspirin-Acetaminophen (GOODYS BODY PAIN PO) Take 1 packet by mouth daily.    Yes [provider]    Allergies    Patient has no known allergies.  Review of Systems   Review of Systems  Unable to perform ROS: Acuity of condition    Physical Exam Updated Vital Signs BP (!) 111/60   Pulse 73   Temp 98.1 F (36.7 C) (Oral)   Resp 12   Ht 6\' 2"  (1.88 m)   Wt 69.7 kg   SpO2 96%   BMI 19.73 kg/m   Physical Exam Vitals and nursing note reviewed.  Constitutional:      Appearance: Normal appearance.  HENT:     Head: Normocephalic and atraumatic.     Nose: Nose normal.     Mouth/Throat:     Mouth: Mucous membranes are moist.  Eyes:     Extraocular Movements: Extraocular movements intact.     Pupils: Pupils are equal, round, and reactive to light.  Pulmonary:     Effort: Pulmonary effort is normal. No respiratory distress.  Musculoskeletal:        General: No deformity or signs of injury.     Cervical back: Normal range of motion.  Skin:    General: Skin is warm and dry.     Capillary Refill: Capillary refill takes less than 2 seconds.  Neurological:     Mental Status: He is alert.     Comments: AAOX3 5/5 strenght in RUE, RLE 4/5 strength in LLE, LUE L hemianopia  ED Results / Procedures / Treatments   Labs (all labs ordered are listed, but only abnormal results are displayed) Labs Reviewed  MRSA PCR SCREENING - Abnormal; Notable for the following components:      Result Value   MRSA by PCR POSITIVE (*)    All other components within normal limits  COMPREHENSIVE METABOLIC PANEL - Abnormal; Notable for the following components:   CO2 21 (*)    Glucose, Bld 139 (*)    Calcium 8.1 (*)    Total Protein 5.9 (*)    Albumin 2.8 (*)    AST 14 (*)    All other components within normal limits  CBC - Abnormal; Notable for the following components:   RBC 3.24 (*)    Hemoglobin 10.1 (*)    HCT 32.1 (*)    All other components within normal limits  CBC - Abnormal; Notable for the following components:   RBC 2.93 (*)     Hemoglobin 9.4 (*)    HCT 29.1 (*)    All other components within normal limits  HEMOGLOBIN A1C - Abnormal; Notable for the following components:   Hgb A1c MFr Bld 6.1 (*)    All other components within normal limits  LIPID PANEL - Abnormal; Notable for the following components:   LDL Cholesterol 128 (*)    All other components within normal limits  BASIC METABOLIC PANEL - Abnormal; Notable for the following components:   CO2 21 (*)    Glucose, Bld 119 (*)    Calcium 8.4 (*)    GFR calc non Af Amer 54 (*)    All other components within normal limits  SARS CORONAVIRUS 2 BY RT PCR (HOSPITAL ORDER, [place])    EKG None  Radiology CT HEAD WO CONTRAST  Result Date: 11/11/2019 CLINICAL DATA:  84 year old male code stroke presentation today with right ICA occlusion. Status post endovascular revascularization of right ICA and MCA. EXAM: CT HEAD WITHOUT CONTRAST TECHNIQUE: Contiguous axial images were obtained from the base of the skull through the vertex without intravenous contrast. COMPARISON:  Post treatment head CT 1302 hours today, and earlier. FINDINGS: Brain: Area of hypodensity in the right inferior frontal gyrus (coronal image 25) seems unchanged from the presentation CT at 0834 hours today. And elsewhere gray-white matter differentiation appears stable. No cortically based acute infarct identified. No intracranial mass effect. No acute intracranial hemorrhage identified. Vascular: Persistent residual intravascular contrast. Skull: Stable.  No acute osseous abnormality identified. Sinuses/Orbits: Visualized paranasal sinuses and mastoids are stable and well pneumatized. Other: No acute orbit or scalp soft tissue findings. IMPRESSION: 1. No acute infarct identified by CT. An area of hypodensity in the right inferior frontal gyrus seems to be new since 2019, but unchanged from the initial head CT this morning - favoring chronic encephalomalacia. 2. No intracranial  hemorrhage identified. Electronically Signed   By: Genevie Ann M.D.   On: 11/11/2019 16:55   CT HEAD WO CONTRAST  Result Date: 11/11/2019 CLINICAL DATA:  Stroke, follow-up.  Follow-up stroke intervention. EXAM: CT HEAD WITHOUT CONTRAST TECHNIQUE: Contiguous axial images were obtained from the base of the skull through the vertex without intravenous contrast. COMPARISON:  CT head performed earlier the same day 11/11/2019, CT angiogram head/neck performed earlier the same day 11/11/2019. FINDINGS: Brain: Redemonstrated mild generalized parenchymal atrophy. There is no evidence of acute infarction. Unchanged small focus of hyperdensity within the posterior right lentiform nucleus likely reflecting vascular enhancement. No acute intracranial hemorrhage  is identified. Stable, mild ill-defined hypoattenuation within the cerebral white matter which is nonspecific, but consistent with chronic small vessel ischemic disease. No extra-axial fluid collection. No evidence of intracranial mass. No midline shift. Vascular: Residual circulating contrast limits evaluation for hyperdense vessels. Atherosclerotic calcifications. Skull: Normal. Negative for fracture or focal lesion. Sinuses/Orbits: No acute orbital abnormality. No significant paranasal sinus disease or mastoid effusion at the imaged levels. Partially visualized life-support tubes. IMPRESSION: Residual circulating contrast. No CT evidence of acute infarct or acute intracranial hemorrhage. Stable generalized parenchymal atrophy and chronic small vessel ischemic disease. Electronically Signed   By: Kellie Simmering DO   On: 11/11/2019 13:26   CT CEREBRAL PERFUSION W CONTRAST  Result Date: 11/11/2019 CLINICAL DATA:  Carotid occlusion EXAM: CT ANGIOGRAPHY HEAD AND NECK CT PERFUSION BRAIN TECHNIQUE: Multidetector CT imaging of the head was performed using the standard protocol without contrast. Multiphase CT imaging of the brain was performed following IV bolus contrast  injection. Subsequent parametric perfusion maps were calculated using RAPID software. CONTRAST:  66mL OMNIPAQUE IOHEXOL 350 MG/ML SOLN COMPARISON:  Earlier same day FINDINGS: CT HEAD FINDINGS Brain: There is no acute intracranial hemorrhage, mass effect, or edema. No new loss of gray-white differentiation. Ventricles are stable in size. Vascular: Residual intravascular contrast. Skull: Unremarkable. Sinuses/Orbits: No acute abnormality. Other: None. CT Brain Perfusion Findings: ASPECTS: 10 CBF (<30%) Volume: 83mL Perfusion (Tmax>6.0s) volume: 32mL Mismatch Volume: 57mL Infarction Location: Right parietal IMPRESSION: No acute intracranial hemorrhage or new loss of gray-white differentiation. Perfusion software demonstrates 3 mL core infarction and 13 mL penumbra in the right parietal lobe. However, the source data demonstrates motion degradation. Electronically Signed   By: Macy Mis M.D.   On: 11/11/2019 11:00   CT HEAD CODE STROKE WO CONTRAST  Result Date: 11/11/2019 CLINICAL DATA:  Carotid occlusion EXAM: CT ANGIOGRAPHY HEAD AND NECK CT PERFUSION BRAIN TECHNIQUE: Multidetector CT imaging of the head was performed using the standard protocol without contrast. Multiphase CT imaging of the brain was performed following IV bolus contrast injection. Subsequent parametric perfusion maps were calculated using RAPID software. CONTRAST:  1mL OMNIPAQUE IOHEXOL 350 MG/ML SOLN COMPARISON:  Earlier same day FINDINGS: CT HEAD FINDINGS Brain: There is no acute intracranial hemorrhage, mass effect, or edema. No new loss of gray-white differentiation. Ventricles are stable in size. Vascular: Residual intravascular contrast. Skull: Unremarkable. Sinuses/Orbits: No acute abnormality. Other: None. CT Brain Perfusion Findings: ASPECTS: 10 CBF (<30%) Volume: 26mL Perfusion (Tmax>6.0s) volume: 19mL Mismatch Volume: 32mL Infarction Location: Right parietal IMPRESSION: No acute intracranial hemorrhage or new loss of gray-white  differentiation. Perfusion software demonstrates 3 mL core infarction and 13 mL penumbra in the right parietal lobe. However, the source data demonstrates motion degradation. Electronically Signed   By: Macy Mis M.D.   On: 11/11/2019 11:00    Procedures Procedures (including critical care time)  Medications Ordered in ED Medications  iohexol (OMNIPAQUE) 240 MG/ML injection (has no administration in time range)  aspirin 81 MG chewable tablet (has no administration in time range)  verapamil (ISOPTIN) 2.5 MG/ML injection (has no administration in time range)  clopidogrel (PLAVIX) 300 MG tablet (has no administration in time range)  nitroGLYCERIN 100 mcg/mL intra-arterial injection (has no administration in time range)  tirofiban (AGGRASTAT) 5-0.9 MG/100ML-% injection (has no administration in time range)  eptifibatide (INTEGRILIN) 20 MG/10ML injection (has no administration in time range)   stroke: mapping our early stages of recovery book (has no administration in time range)  0.9 %  sodium chloride  infusion (has no administration in time range)  acetaminophen (TYLENOL) tablet 650 mg (has no administration in time range)    Or  acetaminophen (TYLENOL) 160 MG/5ML solution 650 mg (has no administration in time range)    Or  acetaminophen (TYLENOL) suppository 650 mg (has no administration in time range)  labetalol (NORMODYNE) injection 10 mg (has no administration in time range)    And  nicardipine (CARDENE) 20mg  in 0.86% saline 218ml IV infusion (0.1 mg/ml) (has no administration in time range)  pantoprazole (PROTONIX) injection 40 mg (40 mg Intravenous Given 11/11/19 2246)  ceFAZolin (ANCEF) 2-4 GM/100ML-% IVPB (has no administration in time range)  cangrelor (KENGREAL) 50 MG SOLR (has no administration in time range)  0.9 %  sodium chloride infusion ( Intravenous Rate/Dose Verify 11/12/19 0700)  clevidipine (CLEVIPREX) infusion 0.5 mg/mL (20 mg/hr Intravenous Rate/Dose Verify 11/12/19  0700)  aspirin chewable tablet 81 mg (has no administration in time range)    Or  aspirin chewable tablet 81 mg (has no administration in time range)  ticagrelor (BRILINTA) tablet 90 mg (90 mg Oral Given 11/11/19 2007)    Or  ticagrelor (BRILINTA) tablet 90 mg ( Per Tube See Alternative 11/11/19 2007)  mupirocin ointment (BACTROBAN) 2 % 1 application (1 application Nasal Given 11/11/19 2246)  Chlorhexidine Gluconate Cloth 2 % PADS 6 each (has no administration in time range)  iohexol (OMNIPAQUE) 350 MG/ML injection 40 mL (40 mLs Intravenous Contrast Given 11/11/19 1041)  ticagrelor (BRILINTA) tablet (180 mg Per Tube Given 11/11/19 1130)  aspirin chewable tablet (81 mg Per Tube Given 11/11/19 1130)  cangrelor (KENGREAL) bolus via infusion (1,045.5 mcg Intravenous Given 11/11/19 1159)  cangrelor (KENGREAL) 50,000 mcg in sodium chloride 0.9 % 250 mL (200 mcg/mL) infusion (2 mcg/kg/min  69.7 kg (Order-Specific) Intravenous New Bag/Given 11/11/19 1200)  iohexol (OMNIPAQUE) 300 MG/ML solution 150 mL (60 mLs Intra-arterial Contrast Given 11/11/19 1232)  sodium chloride 0.9 % bolus 500 mL (500 mLs Intravenous Bolus from Bag 11/12/19 0116)    ED Course  I have reviewed the triage vital signs and the nursing notes.  Pertinent labs & imaging results that were available during my care of the patient were reviewed by me and considered in my medical decision making (see chart for details).  Clinical Course as of Nov 11 805  Wed Nov 11, 2019  1025 At bedside on patient arrival   [RD]    Clinical Course User Index [RD] Lucrezia Starch, MD   MDM Rules/Calculators/A&P                          84 year old male presented to our ER as transfer from [facility] ER.  Patient diagnosed with acute occlusion of right carotid artery, given TPA at [facility].  I evaluated patient alongside neurology on arrival in ER, patient went straight to CT scan for CT perfusion.  After CT perfusion, neuro took patient up to IR for  further intervention.  Neuro to admit to ICU for further management.  Final Clinical Impression(s) / ED Diagnoses Final diagnoses:  Stroke [facility])  Cerebrovascular accident (CVA), unspecified mechanism [facility])    Rx / DC Orders ED Discharge Orders    None       Lucrezia Starch, MD 11/12/19 [phone]

## 2019-11-11 NOTE — Procedures (Signed)
S/P RT common carotid  Arteriogram followed by revascularization of symptomatic acute  occlusion of prox RT ICA with stent assisted angioplasty  With prox protection achieving a TICI 2C revascularization of RT MCA. Post procedure  Hemostasis obtained in the Rt groin puncture site with 14f angioseal closure device. Distal pulses Dopplerable RT DP . Remainder palpable unchanged. Post CT brain NO ICH or mass effect.  Patient to PACU for extubation and d then to 4N 32. S.Waylin Dorko MD

## 2019-11-11 NOTE — Progress Notes (Signed)
Patient arrived to 4N.  Per Stroke response RN note TPA given at [phone] at [facility].  Patient very hard of hearing but able to follow commands and alert and oriented at this time.  Groin site level 0, time dated on dressing is 1231.  First vitals recorded on 4N at 1445.    CT completed and Cangrelor stopped at 1600.

## 2019-11-11 NOTE — Consult Note (Signed)
INR. Left upper arm bruising and swelling brought to medication and notified few minutes ago.  On inspection, firm swelling noted along the anterior aspect of the left upper arm just above the antecubital fossa.  The circumference measures approximately 11.5 inches.  Distal radial pulse on the left and the brachial pulse palpated to be present.  The left arm appears warm.  No evidence of skin tightening noted.  Plan.  Continue close observation.  Apply a crpe bandage for compression at the site of the fullness.Marland Kitchen  S.Kevonna Nolte MD

## 2019-11-11 NOTE — Code Documentation (Signed)
84 yo male with Hx of COPD, Bronchitis, and HTN coming from [facility] ED with complaints of sudden onset of left sided weakness this morning. Pt was at home with daughter. LKW 0800. Daughter heard a thud and found patient down on the ground with left facial droop and left sided weakness. EMS took patient to [facility].   tPA given by Teleneurology at [phone]. 6.3 mg bolus with remaining 56.5 mg given over one hour. Post-tPA flush started at 1013 per reporting staff.  When patient arrived, he was receiving post-tPA flush of 50 cc running at a rate of 56.5 with 40 cc still remaining. Initial NIHSS 8 upon arrival at [facility] ED - Left facial droop, left hemianopia, left sided neglect, left sensory decreased, and left arm weakness. Pt taken directly to CT. CTP completed.   MD Leonel Ramsay and MD Deveshwar discussed potential for patient to proceed to IR. Daughter called by MD to confirm consent. Delay in activating IR due to need for CTP upon arrival and decision making.   Patient arrived in [place] at 1055 and IR activated officially at 45 once daughter confirmed consent. Report given to [place], Tim[place] RN and [employer], Immunologist. Patient taken to IR Suite and transferred to table.   See Stroke Timeline for details and times.

## 2019-11-11 NOTE — Procedures (Signed)
Echo attempted. Nurse requested that echo attempt be made later since patient was to be taken to CT.

## 2019-11-11 NOTE — Progress Notes (Addendum)
Patient ID: Eldin Bonsell, male   DOB: 05/24/1935, 84 y.o.   MRN: [id number]. 84 year old right-handed male last seen well at 8 AM.  Modified Rankin scale score 0.  New onset of left-sided weakness neglect and visual field deficit.  CT scan of the brain no evidence of hemorrhage.  Aspects 10.  CTA of the head and neck demonstrates occlusion of the right internal carotid artery just distal to the bulb with an ill-defined string sign distally..    Distal reconstitution of the right internal carotid artery in the cavernous segment via the ipsilateral ophthalmic arter noted..  CT perfusion map CBF less than 30% 3 mL.  T-max greater than 6 seconds 16 mL.  Mismatch 13 mL.Marland Kitchen  Option of endovascular revascularization of the occluded right internal carotid artery discussed with the patient's daughter..  Reasons, alternatives, and procedure discussed.  The risks of intracranial hemorrhage of 10%, worsening,neurologic deficit, death, and ability to revascularize reviewed.  Daughter expressed understanding of  patient medical condition and of treatment.  Informed witnessed consent was obtained from the daughter.  S.Dave Mergen MD

## 2019-11-11 NOTE — Sedation Documentation (Signed)
Right femoral sheath removed, 50fr angioseal closure device used.

## 2019-11-11 NOTE — Anesthesia Preprocedure Evaluation (Signed)
Anesthesia Evaluation  Patient identified by MRN, date of birth, ID band Patient awake  General Assessment Comment:Hard of hearing  Reviewed: Allergy & Precautions, Patient's Chart, lab work & pertinent test results, Unable to perform ROS - Chart review onlyPreop documentation limited or incomplete due to emergent nature of procedure.  History of Anesthesia Complications Negative for: history of anesthetic complications  Airway Mallampati: I  TM Distance: >3 FB Neck ROM: Full    Dental  (+) Edentulous Upper, Edentulous Lower   Pulmonary COPD,     + decreased breath sounds      Cardiovascular hypertension,  Rhythm:Regular     Neuro/Psych CVA, Residual Symptoms    GI/Hepatic Neg liver ROS,   Endo/Other    Renal/GU negative Renal ROS     Musculoskeletal negative musculoskeletal ROS (+)   Abdominal   Peds  Hematology   Anesthesia Other Findings   Reproductive/Obstetrics                             Anesthesia Physical Anesthesia Plan  ASA: III  Anesthesia Plan: General   Post-op Pain Management:    Induction: Rapid sequence and Cricoid pressure planned  PONV Risk Score and Plan: 2 and Treatment may vary due to age or medical condition, Ondansetron and Dexamethasone  Airway Management Planned: Oral ETT  Additional Equipment: Arterial line  Intra-op Plan:   Post-operative Plan: Possible Post-op intubation/ventilation  Informed Consent:     History available from chart only and Only emergency history available  Plan Discussed with: CRNA and Surgeon  Anesthesia Plan Comments:         Anesthesia Quick Evaluation

## 2019-11-11 NOTE — Progress Notes (Signed)
Patient set up on ventilator in CT. Settings VT 500, 15 RR, 100% and 5 of peep. Patient transported to PACU after CT scan and was extubated by CRNA. No issues noted.

## 2019-11-11 NOTE — H&P (Signed)
Neurology H&P  CC: Left-sided numbness  History is obtained from: Patient, daughter  HPI: Ryan Hodge is a 84 y.o. male who was last seen well at 8 AM and then subsequently noticed to have problems with his left side.  He was taken emergently to [facility] where he was given IV TPA.  CT angiogram there revealed an occluded right carotid.  Due to the concern that the occluded carotid was likely acute in the setting of his acute symptoms, he was brought emergently to [facility] where CT perfusion was obtained.  Though with T-max greater than six, the right hemisphere did not look significantly hypoperfused, with a T-max greater than four there was significant perfusion deficit in that side.  He was waxing and waning with his left arm paresis therefore after discussion with Dr. Estanislado Pandy of neuro interventional radiology, the decision was made to proceed with acute carotid stenting.  At baseline, he is quite hard of hearing, but otherwise relatively independent.  He does take care of his own activities of daily living.   LKW: 8am tpa given?: yes IR Thrombectomy? yes Modified Rankin Scale: 0-Completely asymptomatic and back to baseline post- stroke NIHSS: Initially seven, improved to six   ROS: A complete ROS was performed and is negative except as noted in the HPI.   Past Medical History:  Diagnosis Date  . COPD (chronic obstructive pulmonary disease) ([place])   . Hypertension      FHx Daughter - stroke  Social History:  reports that he has never smoked. He has never used smokeless tobacco. He reports that he does not drink alcohol and does not use drugs.   Prior to Admission medications   Not on File  amlodipine [place]   Exam: Current vital signs: BP (!) 129/96   Pulse 73   SpO2 97%    Physical Exam  Constitutional: Appears well-developed and well-nourished.  Psych: Affect appropriate to situation Eyes: No scleral injection HENT: No OP obstrucion Head:  Normocephalic.  Cardiovascular: Normal rate and regular rhythm.  Respiratory: Effort normal and breath sounds normal to anterior ascultation GI: Soft.  No distension. There is no tenderness.  Skin: WDI  Neuro: Mental Status: Patient is awake, alert, oriented to person, place, month, year, and situation. Patient is able to give a clear and coherent history. No signs of aphasia.  He has a significant neglect Cranial Nerves: II: Left hemianopia pupils are equal, round, and reactive to light.   III,IV, VI: EOMI without ptosis or diploplia.  V: Facial sensation is diminished on the left VII: Facial movement with mild nasolabial fold flattening on the left Motor: Tone is normal. Bulk is normal. 5/5 strength was present on the right, he has 4/5 left arm weakness.  Sensory: Sensation is absent on the left.  Cerebellar: no clear ataxia   I have reviewed labs in epic and the pertinent results are: Normal creatinine, K 4.1  I have reviewed the images obtained:CT head - negative, CTA - occluded carotid.   Primary Diagnosis:  Cerebral infarction due to occlusion or stenosis of right carotid artery.   Secondary Diagnosis: Essential (primary) hypertension   Impression: 84 year old male with carotid occlusion on the right and possibly distal embolus.  Given the waxing and waning symptoms and presumed acute nature of the occlusion, the decision was made to perform an angiogram to see if it behaved as one would expect for acute occlusion, with consideration of acute stenting.  Plan: - HgbA1c, fasting lipid panel - MRI  of the brain without contrast - Frequent neuro checks - Echocardiogram - Prophylactic therapy-Antiplatelet med: pending outcome of procedure - Risk factor modification - Telemetry monitoring - PT consult, OT consult, Speech consult - Stroke team to follow    This patient is critically ill and at significant risk of neurological worsening, death and care requires  constant monitoring of vital signs, hemodynamics,respiratory and cardiac monitoring, neurological assessment, discussion with family, other specialists and medical decision making of high complexity. I spent 40 minutes of neurocritical care time  in the care of  this patient. This was time spent independent of any time provided by nurse practitioner or PA.  Roland Rack, MD [facility] [phone]  If 7pm- 7am, please page neurology on call as listed in [place].

## 2019-11-11 NOTE — Transfer of Care (Signed)
Immediate Anesthesia Transfer of Care Note  Patient: Ryan Hodge  Procedure(s) Performed: IR WITH ANESTHESIA - CODE STROKE (N/A )  Patient Location: PACU  Anesthesia Type:General  Level of Consciousness: Patient remains intubated per anesthesia plan  Airway & Oxygen Therapy: Patient remains intubated per anesthesia plan and Patient placed on Ventilator (see vital sign flow sheet for setting)  Post-op Assessment: Report given to RN and Post -op Vital signs reviewed and stable  Post vital signs: Reviewed and stable  Last Vitals:  Vitals Value Taken Time  BP 125/67 11/11/19 1330  Temp    Pulse 54 11/11/19 1335  Resp 13 11/11/19 1335  SpO2 100 % 11/11/19 1335  Vitals shown include unvalidated device data.  Last Pain: There were no vitals filed for this visit.       Complications: No complications documented.

## 2019-11-11 NOTE — Anesthesia Procedure Notes (Signed)
Procedure Name: Intubation Date/Time: 11/11/2019 11:36 AM Performed by: Kathryne Hitch, CRNA Pre-anesthesia Checklist: Patient identified, Emergency Drugs available, Suction available and Patient being monitored Patient Re-evaluated:Patient Re-evaluated prior to induction Oxygen Delivery Method: Circle system utilized Preoxygenation: Pre-oxygenation with 100% oxygen Induction Type: IV induction and Rapid sequence Ventilation: Mask ventilation without difficulty Laryngoscope Size: Glidescope and 4 Grade View: Grade I Tube type: Oral Tube size: 7.5 mm Number of attempts: 1 Airway Equipment and Method: Stylet and Oral airway Placement Confirmation: ETT inserted through vocal cords under direct vision,  positive ETCO2 and breath sounds checked- equal and bilateral Secured at: 23 cm Tube secured with: Tape Dental Injury: Teeth and Oropharynx as per pre-operative assessment

## 2019-11-12 ENCOUNTER — Inpatient Hospital Stay (HOSPITAL_COMMUNITY): Payer: Medicare HMO

## 2019-11-12 ENCOUNTER — Other Ambulatory Visit (HOSPITAL_COMMUNITY): Payer: Medicare HMO

## 2019-11-12 ENCOUNTER — Encounter (HOSPITAL_COMMUNITY): Payer: Self-pay | Admitting: Interventional Radiology

## 2019-11-12 DIAGNOSIS — D62 Acute posthemorrhagic anemia: Secondary | ICD-10-CM

## 2019-11-12 DIAGNOSIS — E7800 Pure hypercholesterolemia, unspecified: Secondary | ICD-10-CM | POA: Diagnosis not present

## 2019-11-12 DIAGNOSIS — I63231 Cerebral infarction due to unspecified occlusion or stenosis of right carotid arteries: Secondary | ICD-10-CM

## 2019-11-12 DIAGNOSIS — I10 Essential (primary) hypertension: Secondary | ICD-10-CM | POA: Diagnosis not present

## 2019-11-12 DIAGNOSIS — I6389 Other cerebral infarction: Secondary | ICD-10-CM | POA: Diagnosis not present

## 2019-11-12 LAB — ECHOCARDIOGRAM COMPLETE
AR max vel: 1.87 cm2
AV Area VTI: 2.16 cm2
AV Area mean vel: 1.91 cm2
AV Mean grad: 3 mmHg
AV Peak grad: 7 mmHg
Ao pk vel: 1.32 m/s
Area-P 1/2: 2.22 cm2
Height: 74 in
S' Lateral: 3.9 cm
Weight: 2458.57 oz

## 2019-11-12 LAB — LIPID PANEL
Cholesterol: 193 mg/dL (ref 0–200)
HDL: 51 mg/dL (ref >40)
LDL Cholesterol: 128 mg/dL — ABNORMAL HIGH (ref 0–99)
Total CHOL/HDL Ratio: 3.8 RATIO
Triglycerides: 71 mg/dL (ref <150)
VLDL: 14 mg/dL (ref 0–40)

## 2019-11-12 LAB — BASIC METABOLIC PANEL
Anion gap: 8 (ref 5–15)
BUN: 20 mg/dL (ref 8–23)
CO2: 21 mmol/L — ABNORMAL LOW (ref 22–32)
Calcium: 8.4 mg/dL — ABNORMAL LOW (ref 8.9–10.3)
Chloride: 108 mmol/L (ref 98–111)
Creatinine, Ser: 1.24 mg/dL (ref 0.61–1.24)
GFR calc Af Amer: >60 mL/min (ref >60)
GFR calc non Af Amer: 54 mL/min — ABNORMAL LOW (ref >60)
Glucose, Bld: 119 mg/dL — ABNORMAL HIGH (ref 70–99)
Potassium: 4.7 mmol/L (ref 3.5–5.1)
Sodium: 137 mmol/L (ref 135–145)

## 2019-11-12 LAB — CBC
HCT: 29.1 % — ABNORMAL LOW (ref 39.0–52.0)
Hemoglobin: 9.4 g/dL — ABNORMAL LOW (ref 13.0–17.0)
MCH: 32.1 pg (ref 26.0–34.0)
MCHC: 32.3 g/dL (ref 30.0–36.0)
MCV: 99.3 fL (ref 80.0–100.0)
Platelets: 195 10*3/uL (ref 150–400)
RBC: 2.93 MIL/uL — ABNORMAL LOW (ref 4.22–5.81)
RDW: 14.4 % (ref 11.5–15.5)
WBC: 8.1 10*3/uL (ref 4.0–10.5)
nRBC: 0 % (ref 0.0–0.2)

## 2019-11-12 LAB — HEMOGLOBIN A1C
Hgb A1c MFr Bld: 6.1 % — ABNORMAL HIGH (ref 4.8–5.6)
Mean Plasma Glucose: 128.37 mg/dL

## 2019-11-12 MED ORDER — PERFLUTREN LIPID MICROSPHERE
1.0000 mL | INTRAVENOUS | Status: AC | PRN
  Administered 2019-11-12: 4 mL via INTRAVENOUS
  Filled 2019-11-12: qty 10

## 2019-11-12 MED ORDER — SODIUM CHLORIDE 0.9 % IV BOLUS
500.0000 mL | Freq: Once | INTRAVENOUS | Status: AC
  Administered 2019-11-12: 500 mL via INTRAVENOUS

## 2019-11-12 MED ORDER — ATORVASTATIN CALCIUM 40 MG PO TABS
40.0000 mg | ORAL_TABLET | Freq: Every day | ORAL | Status: DC
  Administered 2019-11-12 – 2019-11-13 (×2): 40 mg via ORAL
  Filled 2019-11-12 (×2): qty 1

## 2019-11-12 MED ORDER — PANTOPRAZOLE SODIUM 40 MG PO TBEC
40.0000 mg | DELAYED_RELEASE_TABLET | Freq: Every day | ORAL | Status: DC
  Administered 2019-11-12: 40 mg via ORAL
  Filled 2019-11-12: qty 1

## 2019-11-12 MED ORDER — LABETALOL HCL 5 MG/ML IV SOLN: 5.0000 mg | INTRAVENOUS | Status: DC | PRN

## 2019-11-12 NOTE — Progress Notes (Signed)
OT Cancellation Note  Patient Details Name: Tige Meas MRN: [id number] DOB: 1935-06-13   Cancelled Treatment:    Reason Eval/Treat Not Completed: Patient at procedure or test/ unavailable (Pt is currently getting echo, will continue to follow as available and appropriate)  Zenovia Jarred, MSOT, OTR/L [facility] Office Number: [phone] Pager: [phone]  Zenovia Jarred 11/12/2019, 4:05 PM

## 2019-11-12 NOTE — Progress Notes (Signed)
Referring Physician(s): Code Stroke  Supervising Physician: Luanne Bras  Patient Status:  [facility] - In-pt  Chief Complaint: R ICA occlusion CODE STROKE  Subjective: Patient assessed at bedside this AM.  He is conversant, but extremely hard or hearing.  Moving all extremities.  For therapy assessments today.   Allergies: Patient has no known allergies.  Medications: Prior to Admission medications   Medication Sig Start Date End Date Taking? Authorizing Provider  amLODipine (NORVASC) 10 MG tablet Take 10 mg by mouth daily. 10/09/19  Yes [provider]  Aspirin-Acetaminophen (GOODYS BODY PAIN PO) Take 1 packet by mouth daily.   Yes [provider]     Vital Signs: BP (!) 132/70   Pulse 88   Temp 97.8 F (36.6 C) (Oral)   Resp 21   Ht 6\' 2"  (1.88 m)   Wt 153 lb 10.6 oz (69.7 kg)   SpO2 94%   BMI 19.73 kg/m   Physical Exam  NAD, alert, sitting up in bed. Hard of hearing.  Neuro: Following commands, EOMs intact.  PERRL.  Conversant with intelligible speech.  No word finding difficulties. Moving all extremities. Right upper and lower extremity strength 5/5.  Left sided upper and lower extremity strength 5/5.  MSK: He does have a large medial-to-lateral distribution hematoma in the upper arm.  There is swelling. Compression dressing in place.  Groin: soft.  No evidence of hematoma or pseudoaneurysm.  Imaging: CT HEAD WO CONTRAST  Result Date: 11/11/2019 CLINICAL DATA:  84 year old male code stroke presentation today with right ICA occlusion. Status post endovascular revascularization of right ICA and MCA. EXAM: CT HEAD WITHOUT CONTRAST TECHNIQUE: Contiguous axial images were obtained from the base of the skull through the vertex without intravenous contrast. COMPARISON:  Post treatment head CT 1302 hours today, and earlier. FINDINGS: Brain: Area of hypodensity in the right inferior frontal gyrus (coronal image 25) seems unchanged from the  presentation CT at 0834 hours today. And elsewhere gray-white matter differentiation appears stable. No cortically based acute infarct identified. No intracranial mass effect. No acute intracranial hemorrhage identified. Vascular: Persistent residual intravascular contrast. Skull: Stable.  No acute osseous abnormality identified. Sinuses/Orbits: Visualized paranasal sinuses and mastoids are stable and well pneumatized. Other: No acute orbit or scalp soft tissue findings. IMPRESSION: 1. No acute infarct identified by CT. An area of hypodensity in the right inferior frontal gyrus seems to be new since 2019, but unchanged from the initial head CT this morning - favoring chronic encephalomalacia. 2. No intracranial hemorrhage identified. Electronically Signed   By: Genevie Ann M.D.   On: 11/11/2019 16:55   CT HEAD WO CONTRAST  Result Date: 11/11/2019 CLINICAL DATA:  Stroke, follow-up.  Follow-up stroke intervention. EXAM: CT HEAD WITHOUT CONTRAST TECHNIQUE: Contiguous axial images were obtained from the base of the skull through the vertex without intravenous contrast. COMPARISON:  CT head performed earlier the same day 11/11/2019, CT angiogram head/neck performed earlier the same day 11/11/2019. FINDINGS: Brain: Redemonstrated mild generalized parenchymal atrophy. There is no evidence of acute infarction. Unchanged small focus of hyperdensity within the posterior right lentiform nucleus likely reflecting vascular enhancement. No acute intracranial hemorrhage is identified. Stable, mild ill-defined hypoattenuation within the cerebral white matter which is nonspecific, but consistent with chronic small vessel ischemic disease. No extra-axial fluid collection. No evidence of intracranial mass. No midline shift. Vascular: Residual circulating contrast limits evaluation for hyperdense vessels. Atherosclerotic calcifications. Skull: Normal. Negative for fracture or focal lesion. Sinuses/Orbits: No acute orbital abnormality.  No significant paranasal sinus disease or mastoid effusion at the imaged levels. Partially visualized life-support tubes. IMPRESSION: Residual circulating contrast. No CT evidence of acute infarct or acute intracranial hemorrhage. Stable generalized parenchymal atrophy and chronic small vessel ischemic disease. Electronically Signed   By: Kellie Simmering DO   On: 11/11/2019 13:26   CT CEREBRAL PERFUSION W CONTRAST  Result Date: 11/11/2019 CLINICAL DATA:  Carotid occlusion EXAM: CT ANGIOGRAPHY HEAD AND NECK CT PERFUSION BRAIN TECHNIQUE: Multidetector CT imaging of the head was performed using the standard protocol without contrast. Multiphase CT imaging of the brain was performed following IV bolus contrast injection. Subsequent parametric perfusion maps were calculated using RAPID software. CONTRAST:  65mL OMNIPAQUE IOHEXOL 350 MG/ML SOLN COMPARISON:  Earlier same day FINDINGS: CT HEAD FINDINGS Brain: There is no acute intracranial hemorrhage, mass effect, or edema. No new loss of gray-white differentiation. Ventricles are stable in size. Vascular: Residual intravascular contrast. Skull: Unremarkable. Sinuses/Orbits: No acute abnormality. Other: None. CT Brain Perfusion Findings: ASPECTS: 10 CBF (<30%) Volume: 33mL Perfusion (Tmax>6.0s) volume: 51mL Mismatch Volume: 23mL Infarction Location: Right parietal IMPRESSION: No acute intracranial hemorrhage or new loss of gray-white differentiation. Perfusion software demonstrates 3 mL core infarction and 13 mL penumbra in the right parietal lobe. However, the source data demonstrates motion degradation. Electronically Signed   By: Macy Mis M.D.   On: 11/11/2019 11:00   CT HEAD CODE STROKE WO CONTRAST  Result Date: 11/11/2019 CLINICAL DATA:  Carotid occlusion EXAM: CT ANGIOGRAPHY HEAD AND NECK CT PERFUSION BRAIN TECHNIQUE: Multidetector CT imaging of the head was performed using the standard protocol without contrast. Multiphase CT imaging of the brain was  performed following IV bolus contrast injection. Subsequent parametric perfusion maps were calculated using RAPID software. CONTRAST:  54mL OMNIPAQUE IOHEXOL 350 MG/ML SOLN COMPARISON:  Earlier same day FINDINGS: CT HEAD FINDINGS Brain: There is no acute intracranial hemorrhage, mass effect, or edema. No new loss of gray-white differentiation. Ventricles are stable in size. Vascular: Residual intravascular contrast. Skull: Unremarkable. Sinuses/Orbits: No acute abnormality. Other: None. CT Brain Perfusion Findings: ASPECTS: 10 CBF (<30%) Volume: 75mL Perfusion (Tmax>6.0s) volume: 88mL Mismatch Volume: 81mL Infarction Location: Right parietal IMPRESSION: No acute intracranial hemorrhage or new loss of gray-white differentiation. Perfusion software demonstrates 3 mL core infarction and 13 mL penumbra in the right parietal lobe. However, the source data demonstrates motion degradation. Electronically Signed   By: Macy Mis M.D.   On: 11/11/2019 11:00    Labs:  CBC: Recent Labs    11/11/19 1440 11/12/19 0445  WBC 7.1 8.1  HGB 10.1* 9.4*  HCT 32.1* 29.1*  PLT 187 195    COAGS: No results for input(s): INR, APTT in the last 8760 hours.  BMP: Recent Labs    11/11/19 1440 11/12/19 0445  NA 137 137  K 4.0 4.7  CL 106 108  CO2 21* 21*  GLUCOSE 139* 119*  BUN 19 20  CALCIUM 8.1* 8.4*  CREATININE 1.10 1.24  GFRNONAA >60 54*  GFRAA >60 >60    LIVER FUNCTION TESTS: Recent Labs    11/11/19 1440  BILITOT 0.4  AST 14*  ALT 11  ALKPHOS 64  PROT 5.9*  ALBUMIN 2.8*    Assessment and Plan: Symptomatic acute occlusion of prox RT ICA with stent assisted angioplasty  With prox protection achieving a TICI 2C revascularization of RT MCA Patient assessed at bedside this AM alongside Dr. Estanislado Pandy. He is doing well s/p intervention yesterday.  He is moving all extremities, conversing,  working with therapies this AM.  Groin site soft and intact.  He will need to continue DAPT with aspirin  81mg  and Plavix 75mg  daily and follow-up with IR.  Further plans per Neuro.   Electronically Signed: Docia Barrier, PA 11/12/2019, 9:54 AM   I spent a total of 15 Minutes at the the patient's bedside AND on the patient's hospital floor or unit, greater than 50% of which was counseling/coordinating care for occluded R ICA.

## 2019-11-12 NOTE — Progress Notes (Signed)
STROKE TEAM PROGRESS NOTE   INTERVAL HISTORY RN at bedside.  Patient severe hard of hearing.  However, patient neuro intact without focal deficit.  He had right ICA stent placed, on aspirin Brilinta.  PT/OT recommend home PT.  Vitals:   11/12/19 0700 11/12/19 0730 11/12/19 0800 11/12/19 0830  BP: (!) 111/60 123/78 118/72 (!) 132/70  Pulse: 73 73 76 88  Resp: 12 19 16 21   Temp:   97.8 F (36.6 C)   TempSrc:   Oral   SpO2: 96% 98% 92% 94%  Weight:      Height:       CBC:  Recent Labs  Lab 11/11/19 1440 11/12/19 0445  WBC 7.1 8.1  HGB 10.1* 9.4*  HCT 32.1* 29.1*  MCV 99.1 99.3  PLT 187 782   Basic Metabolic Panel:  Recent Labs  Lab 11/11/19 1440 11/12/19 0445  NA 137 137  K 4.0 4.7  CL 106 108  CO2 21* 21*  GLUCOSE 139* 119*  BUN 19 20  CREATININE 1.10 1.24  CALCIUM 8.1* 8.4*   Lipid Panel:  Recent Labs  Lab 11/12/19 0445  CHOL 193  TRIG 71  HDL 51  CHOLHDL 3.8  VLDL 14  LDLCALC 128*   HgbA1c:  Recent Labs  Lab 11/12/19 0445  HGBA1C 6.1*   Urine Drug Screen: No results for input(s): LABOPIA, COCAINSCRNUR, LABBENZ, AMPHETMU, THCU, LABBARB in the last 168 hours.  Alcohol Level No results for input(s): ETH in the last 168 hours.  IMAGING past 24 hours CT HEAD WO CONTRAST  Result Date: 11/11/2019 CLINICAL DATA:  84 year old male code stroke presentation today with right ICA occlusion. Status post endovascular revascularization of right ICA and MCA. EXAM: CT HEAD WITHOUT CONTRAST TECHNIQUE: Contiguous axial images were obtained from the base of the skull through the vertex without intravenous contrast. COMPARISON:  Post treatment head CT 1302 hours today, and earlier. FINDINGS: Brain: Area of hypodensity in the right inferior frontal gyrus (coronal image 25) seems unchanged from the presentation CT at 0834 hours today. And elsewhere gray-white matter differentiation appears stable. No cortically based acute infarct identified. No intracranial mass effect. No  acute intracranial hemorrhage identified. Vascular: Persistent residual intravascular contrast. Skull: Stable.  No acute osseous abnormality identified. Sinuses/Orbits: Visualized paranasal sinuses and mastoids are stable and well pneumatized. Other: No acute orbit or scalp soft tissue findings. IMPRESSION: 1. No acute infarct identified by CT. An area of hypodensity in the right inferior frontal gyrus seems to be new since 2019, but unchanged from the initial head CT this morning - favoring chronic encephalomalacia. 2. No intracranial hemorrhage identified. Electronically Signed   By: Genevie Ann M.D.   On: 11/11/2019 16:55   CT HEAD WO CONTRAST  Result Date: 11/11/2019 CLINICAL DATA:  Stroke, follow-up.  Follow-up stroke intervention. EXAM: CT HEAD WITHOUT CONTRAST TECHNIQUE: Contiguous axial images were obtained from the base of the skull through the vertex without intravenous contrast. COMPARISON:  CT head performed earlier the same day 11/11/2019, CT angiogram head/neck performed earlier the same day 11/11/2019. FINDINGS: Brain: Redemonstrated mild generalized parenchymal atrophy. There is no evidence of acute infarction. Unchanged small focus of hyperdensity within the posterior right lentiform nucleus likely reflecting vascular enhancement. No acute intracranial hemorrhage is identified. Stable, mild ill-defined hypoattenuation within the cerebral white matter which is nonspecific, but consistent with chronic small vessel ischemic disease. No extra-axial fluid collection. No evidence of intracranial mass. No midline shift. Vascular: Residual circulating contrast limits evaluation for hyperdense vessels.  Atherosclerotic calcifications. Skull: Normal. Negative for fracture or focal lesion. Sinuses/Orbits: No acute orbital abnormality. No significant paranasal sinus disease or mastoid effusion at the imaged levels. Partially visualized life-support tubes. IMPRESSION: Residual circulating contrast. No CT evidence  of acute infarct or acute intracranial hemorrhage. Stable generalized parenchymal atrophy and chronic small vessel ischemic disease. Electronically Signed   By: Kellie Simmering DO   On: 11/11/2019 13:26   CT CEREBRAL PERFUSION W CONTRAST  Result Date: 11/11/2019 CLINICAL DATA:  Carotid occlusion EXAM: CT ANGIOGRAPHY HEAD AND NECK CT PERFUSION BRAIN TECHNIQUE: Multidetector CT imaging of the head was performed using the standard protocol without contrast. Multiphase CT imaging of the brain was performed following IV bolus contrast injection. Subsequent parametric perfusion maps were calculated using RAPID software. CONTRAST:  41mL OMNIPAQUE IOHEXOL 350 MG/ML SOLN COMPARISON:  Earlier same day FINDINGS: CT HEAD FINDINGS Brain: There is no acute intracranial hemorrhage, mass effect, or edema. No new loss of gray-white differentiation. Ventricles are stable in size. Vascular: Residual intravascular contrast. Skull: Unremarkable. Sinuses/Orbits: No acute abnormality. Other: None. CT Brain Perfusion Findings: ASPECTS: 10 CBF (<30%) Volume: 53mL Perfusion (Tmax>6.0s) volume: 34mL Mismatch Volume: 73mL Infarction Location: Right parietal IMPRESSION: No acute intracranial hemorrhage or new loss of gray-white differentiation. Perfusion software demonstrates 3 mL core infarction and 13 mL penumbra in the right parietal lobe. However, the source data demonstrates motion degradation. Electronically Signed   By: Macy Mis M.D.   On: 11/11/2019 11:00   CT HEAD CODE STROKE WO CONTRAST  Result Date: 11/11/2019 CLINICAL DATA:  Carotid occlusion EXAM: CT ANGIOGRAPHY HEAD AND NECK CT PERFUSION BRAIN TECHNIQUE: Multidetector CT imaging of the head was performed using the standard protocol without contrast. Multiphase CT imaging of the brain was performed following IV bolus contrast injection. Subsequent parametric perfusion maps were calculated using RAPID software. CONTRAST:  59mL OMNIPAQUE IOHEXOL 350 MG/ML SOLN COMPARISON:   Earlier same day FINDINGS: CT HEAD FINDINGS Brain: There is no acute intracranial hemorrhage, mass effect, or edema. No new loss of gray-white differentiation. Ventricles are stable in size. Vascular: Residual intravascular contrast. Skull: Unremarkable. Sinuses/Orbits: No acute abnormality. Other: None. CT Brain Perfusion Findings: ASPECTS: 10 CBF (<30%) Volume: 58mL Perfusion (Tmax>6.0s) volume: 75mL Mismatch Volume: 61mL Infarction Location: Right parietal IMPRESSION: No acute intracranial hemorrhage or new loss of gray-white differentiation. Perfusion software demonstrates 3 mL core infarction and 13 mL penumbra in the right parietal lobe. However, the source data demonstrates motion degradation. Electronically Signed   By: Macy Mis M.D.   On: 11/11/2019 11:00    PHYSICAL EXAM  Temp:  [97.7 F (36.5 C)-98.3 F (36.8 C)] 98.3 F (36.8 C) (07/29 1600) Pulse Rate:  [61-101] 98 (07/29 1800) Resp:  [12-30] 28 (07/29 1800) BP: (86-150)/(44-121) 147/96 (07/29 1800) SpO2:  [88 %-100 %] 95 % (07/29 1800) Arterial Line BP: (104-163)/(32-104) 141/49 (07/29 0730)  General - Well nourished, well developed, in no apparent distress.  Ophthalmologic - fundi not visualized due to noncooperation.  Cardiovascular - Regular rhythm and rate.  Mental Status -  Level of arousal and orientation to time, place, and person were intact. Language including expression, naming, repetition, comprehension was assessed and found intact.  Mild to moderate dysarthria  Cranial Nerves II - XII - II - Visual field intact OU. III, IV, VI - Extraocular movements intact. V - Facial sensation intact bilaterally. VII - Facial movement intact bilaterally. VIII - severe hard of hearing & vestibular intact bilaterally. X - Palate elevates symmetrically. XI -  Chin turning & shoulder shrug intact bilaterally. XII - Tongue protrusion intact.  Motor Strength - The patient's strength was normal in all extremities and  pronator drift was absent.  Bulk was normal and fasciculations were absent.   Motor Tone - Muscle tone was assessed at the neck and appendages and was normal.  Reflexes - The patient's reflexes were symmetrical in all extremities and he had no pathological reflexes.  Sensory - Light touch, temperature/pinprick were assessed and were symmetrical.    Coordination - The patient had grossly intact movements in the hands with no ataxia or dysmetria.  Tremor was absent.  Gait and Station - deferred.   ASSESSMENT/PLAN Ryan Hodge is a 84 y.o. male with history of COPD and HTN presenting to [facility] with L sided weakness where he received tPA 11/11/2019 at 0917. Found to have a R ICA occlusion with waxing and waning symptoms. Transferred to [facility] for acute stenting.  Stroke:   R MCA infarcts due to right ICA occlusion s/p tPA + IR w/ TICI2c revascularization of R ICA w/ stent placement, infarct secondary to large vessel disease source   Code Stroke CT head 7/28 1100 No acute abnormality. ASPECTS 10.     CT perfusion R parietal lobe 39mL core, 74mL penumbra  Cerebral angio proximal R ICA occlusion w/ TICI 2c revascularization using stent assisted angioplasty   CT head 7/28 1655 no acute abnormality. Hypodensity R interior frontal gyrus new since 2019 but stable since am. No hemorrhage   MRI 7/29 1034 patchy R frontal parietal jxn infarcts. subacute R corona radiata infarct   MRA  Symmetric flow R ICA stent. R VBJ moderate narrowing.  Carotid doppler pending  2D Echo pending  LDL 128  HgbA1c 6.1  VTE prophylaxis - SCDs   No antithrombotic prior to admission, now on aspirin 81 mg daily and Brilinta (ticagrelor) 90 mg bid. Continue DAPT given stent placement  Therapy recommendations:  pending   Disposition:  pending   Right ICA occlusion, likely acute  CTA showed right ICA occlusion  Stat post IR with right ICA stent  MRA showed  symmetrical flow bilateral ICA  Carotid Doppler pending  On aspirin and Brilinta  Hypertension  Home meds:  norvasc 10 . BP goal 120-140 x 24h following IR procedure and tPA administration.  . On Cleviprex . Long-term BP goal normotensive  Hyperlipidemia  Home meds:  No statin   LDL 128, goal < 70  Added lipitor 40   Continue statin at discharge  Other Stroke Risk Factors  Advanced age  Family hx stroke (daughter)  Other Active Problems  Acute blood loss anemia s/p IR w/ compression to radial site. Hb 10.1->9.4  Hospital day # 1  This patient is critically ill due to right MCA stroke, right ICA acute occlusion, status post TPA and at significant risk of neurological worsening, death form recurrent stroke, hemorrhagic conversion, hemorrhage. This patient's care requires constant monitoring of vital signs, hemodynamics, respiratory and cardiac monitoring, review of multiple databases, neurological assessment, discussion with family, other specialists and medical decision making of high complexity. I spent 30 minutes of neurocritical care time in the care of this patient.  Rosalin Hawking, MD PhD Stroke Neurology 11/12/2019 7:03 PM   To contact Stroke Continuity provider, please refer to [email or web address] After hours, contact General Neurology

## 2019-11-12 NOTE — Evaluation (Addendum)
Speech Language Pathology Evaluation Patient Details Name: Ryan Hodge MRN: [id number] DOB: 10/10/1935 Today's Date: 11/12/2019 Time: 0815-0850 SLP Time Calculation (min) (ACUTE ONLY): 35 min  Problem List:  Patient Active Problem List   Diagnosis Date Noted  . Stroke (cerebrum) ([place]) 11/11/2019  . Cerebral infarction due to occlusion of right carotid artery ([place]) 11/11/2019   Past Medical History:  Past Medical History:  Diagnosis Date  . COPD (chronic obstructive pulmonary disease) ([place])   . Hypertension    Past Surgical History:  Past Surgical History:  Procedure Laterality Date  . ESOPHAGOGASTRODUODENOSCOPY N/A 07/29/2016   Procedure: ESOPHAGOGASTRODUODENOSCOPY (EGD);  Surgeon: Juanita Craver, MD;  Location: [facility] ENDOSCOPY;  Service: Endoscopy;  Laterality: N/A;  . RADIOLOGY WITH ANESTHESIA N/A 11/11/2019   Procedure: IR WITH ANESTHESIA - CODE STROKE;  Surgeon: Luanne Bras, MD;  Location: [facility];  Service: Radiology;  Laterality: N/A;   HPI:  Ryan Hodge is a 84 y.o. male who was last seen well at 8 AM and then subsequently noticed to have problems with his left side.  He was taken emergently to [facility] where he was given IV TPA.  CT angiogram there revealed an occluded right carotid.  Due to the concern that the occluded carotid was likely acute in the setting of his acute symptoms, he was brought emergently to [facility] where CT perfusion was obtained.  Though with T-max greater than six, the right hemisphere did not look significantly hypoperfused, with a T-max greater than four there was significant perfusion deficit in that side.  He was waxing and waning with his left arm paresis therefore after discussion with Dr. Estanislado Pandy of neuro interventional radiology, the decision was made to proceed with acute carotid stenting.  He is now s/p stenting.  CT of the head was showing no acute intracranial hemorrhage or evidence of infarct.  The patient is from  home where he lives with his daughter.  He is VERY hard of hearing.    Assessment / Plan / Recommendation Clinical Impression  Cognitive/linguistic evaluation and motor speech screen were completed.  Of note the patient is very hard of hearing which impacted his ability to perform all requested tasks. Most questions were written down for him to read in order to perform the task accurately.  He reported that he is able to hear the words but he does not know what the person is saying.  Full audiological evaluation would be beneficial after discharge.  Limited cranial nerve exam was completed due to his hearing impairment.  Range of motion of the structures appeared adequate.  Mildly reduced lingual strength was noted bilaterally.  Speech was clear and easy to understand.  No discernible dysarthria or apaxia was noted.  He achieved an overall score of 23 out of a possible 29 points on the Mini Mental State Exam.  The repetition task was unable to be completed due to his hearing impairment.  Deficits were noted for writing a sentence and copying a design.  However, the patient reported that he had issues writing at baseline.  He was oriented x 4 and able to fluently discuss his hospital visit.  Immediate recall of three words was fair and he needed them written on a piece of paper to read to complete the task due to his hearing impairment.  However, given a short delay he was able to recall all 3 novel words.  Attention to task was good.  He was able to provide logical solutions to simple  problems.  Suspect that the mild issues noted on this evaluation are baseline and were impacted by his hearing difficulty.  ST will not follow during acute stay.  Consider outpatient evaluation if the patient notices issues once he is home.      SLP Assessment  SLP Recommendation/Assessment: Patient does not need any further Speech Lanaguage Pathology Services SLP Visit Diagnosis: Cognitive communication deficit (R41.841)     Follow Up Recommendations  None         SLP Evaluation Cognition  Overall Cognitive Status: No family/caregiver present to determine baseline cognitive functioning Orientation Level: Oriented X4 Attention: Sustained Sustained Attention: Appears intact Memory: Appears intact Problem Solving: Appears intact       Comprehension  Auditory Comprehension Overall Auditory Comprehension: Appears within functional limits for tasks assessed Yes/No Questions: Not tested Commands: Within Functional Limits Conversation: Simple Reading Comprehension Reading Status: Within funtional limits    Expression Expression Primary Mode of Expression: Verbal Verbal Expression Overall Verbal Expression: Appears within functional limits for tasks assessed Initiation: No impairment Automatic Speech: Name;Social Response Level of Generative/Spontaneous Verbalization: Sentence Naming: No impairment Pragmatics: No impairment Written Expression Dominant Hand: Right Written Expression: Within Functional Limits   Oral / Motor  Oral Motor/Sensory Function Overall Oral Motor/Sensory Function: Within functional limits Motor Speech Overall Motor Speech: Appears within functional limits for tasks assessed Respiration: Within functional limits Phonation: Normal Resonance: Within functional limits Articulation: Within functional limitis Intelligibility: Intelligible Motor Planning: Witnin functional limits Motor Speech Errors: Not applicable   GO                   Shelly Flatten, MA, CCC-SLP Acute Rehab SLP [phone]  Lamar Sprinkles 11/12/2019, 9:02 AM

## 2019-11-12 NOTE — Progress Notes (Signed)
  Echocardiogram 2D Echocardiogram has been attempted. Patient not in room. Will reattempt at later time.  Rhemi Balbach G Sidonia Nutter 11/12/2019, 9:15 AM

## 2019-11-12 NOTE — Evaluation (Signed)
Physical Therapy Evaluation Patient Details Name: Ryan Hodge MRN: [id number] DOB: 1935-06-25 Today's Date: 11/12/2019   History of Present Illness  Patient is an 84 y/o male admitted with L side weakness s/p TPA and found to have occluded R carotid with ongoing symptoms so decision was made to undergo revascularization of R ICA.  PMH positive for HTN and COPD.  Clinical Impression  Patient presents with decreased mobility due to generalized imbalance and increased fall risk.  He will benefit from skilled PT to address issues and allow safe d/c home with family support and HHPT.     Follow Up Recommendations Home health PT    Equipment Recommendations  Other (comment) (possibly cane)    Recommendations for Other Services       Precautions / Restrictions Precautions Precautions: Fall      Mobility  Bed Mobility Overal bed mobility: Needs Assistance Bed Mobility: Sidelying to Sit;Supine to Sit     Supine to sit: Supervision;HOB elevated     General bed mobility comments: assist for multiple lines  Transfers Overall transfer level: Needs assistance Equipment used: None Transfers: Sit to/from Stand Sit to Stand: Min guard         General transfer comment: for safety/lines/balance  Ambulation/Gait Ambulation/Gait assistance: Min guard;Min assist Gait Distance (Feet): 400 Feet Assistive device: None Gait Pattern/deviations: Step-through pattern;Decreased stride length;Drifts right/left     General Gait Details: some instability noted needing at least minguard A for balance.  Discussed trialing cane vs RW next session  Stairs            Wheelchair Mobility    Modified Rankin (Stroke Patients Only) Modified Rankin (Stroke Patients Only) Pre-Morbid Rankin Score: No significant disability Modified Rankin: Moderately severe disability     Balance Overall balance assessment: Needs assistance   Sitting balance-Leahy Scale: Good        Standing balance-Leahy Scale: Fair Standing balance comment: static balance with S, but needs assist for dynamic mobility                             Pertinent Vitals/Pain Pain Assessment: No/denies pain    Home Living Family/patient expects to be discharged to:: Private residence Living Arrangements: Children Available Help at Discharge: Family Type of Home: House Home Access: Stairs to enter   Technical brewer of Steps: couple Home Layout: One level Home Equipment: Environmental consultant - 2 wheels Additional Comments: states can get a cane    Prior Function Level of Independence: Independent               Hand Dominance        Extremity/Trunk Assessment   Upper Extremity Assessment Upper Extremity Assessment: Overall WFL for tasks assessed    Lower Extremity Assessment Lower Extremity Assessment: Overall WFL for tasks assessed       Communication   Communication: HOH  Cognition Arousal/Alertness: Awake/alert Behavior During Therapy: WFL for tasks assessed/performed Overall Cognitive Status: No family/caregiver present to determine baseline cognitive functioning                                 General Comments: mild deficits per SLP could be baseline      General Comments      Exercises     Assessment/Plan    PT Assessment Patient needs continued PT services  PT Problem List Decreased balance;Decreased knowledge of use of  DME;Decreased safety awareness;Decreased mobility       PT Treatment Interventions DME instruction;Therapeutic activities;Gait training;Therapeutic exercise;Patient/family education;Balance training;Functional mobility training    PT Goals (Current goals can be found in the Care Plan section)  Acute Rehab PT Goals Patient Stated Goal: to go home PT Goal Formulation: With patient Time For Goal Achievement: 11/26/19 Potential to Achieve Goals: Good    Frequency Min 4X/week   Barriers to discharge         Co-evaluation               AM-PAC PT "6 Clicks" Mobility  Outcome Measure Help needed turning from your back to your side while in a flat bed without using bedrails?: None Help needed moving from lying on your back to sitting on the side of a flat bed without using bedrails?: None Help needed moving to and from a bed to a chair (including a wheelchair)?: A Little Help needed standing up from a chair using your arms (e.g., wheelchair or bedside chair)?: None Help needed to walk in hospital room?: A Little Help needed climbing 3-5 steps with a railing? : A Little 6 Click Score: 21    End of Session   Activity Tolerance: Patient tolerated treatment well Patient left: in chair;with call bell/phone within reach;with chair alarm set Nurse Communication: Mobility status PT Visit Diagnosis: Other abnormalities of gait and mobility (R26.89)    Time: 8757-9728 PT Time Calculation (min) (ACUTE ONLY): 23 min   Charges:   PT Evaluation $PT Eval Moderate Complexity: 1 Mod PT Treatments $Gait Training: 8-22 mins        Magda Kiel, PT Acute Rehabilitation Services Pager:[phone] Office:[phone] 11/12/2019   Reginia Naas 11/12/2019, 5:02 PM

## 2019-11-12 NOTE — Anesthesia Postprocedure Evaluation (Signed)
Anesthesia Post Note  Patient: Verne Lanuza  Procedure(s) Performed: IR WITH ANESTHESIA - CODE STROKE (N/A )     Patient location during evaluation: PACU Anesthesia Type: General Level of consciousness: awake and confused Pain management: pain level controlled Vital Signs Assessment: post-procedure vital signs reviewed and stable Respiratory status: spontaneous breathing, nonlabored ventilation, respiratory function stable and patient connected to nasal cannula oxygen Cardiovascular status: blood pressure returned to baseline and stable Postop Assessment: no apparent nausea or vomiting Anesthetic complications: no   No complications documented.  Last Vitals:  Vitals:   11/12/19 1600 11/12/19 1700  BP: (!) 150/70 (!) 136/72  Pulse: 65 71  Resp: 16 20  Temp: 36.8 C   SpO2: 96% 94%    Last Pain:  Vitals:   11/12/19 1600  TempSrc: Oral  PainSc: 0-No pain                 Jalena Vanderlinden

## 2019-11-12 NOTE — Progress Notes (Signed)
  Echocardiogram 2D Echocardiogram has been performed with [facility].  Ryan Hodge 11/12/2019, 4:10 PM

## 2019-11-13 ENCOUNTER — Inpatient Hospital Stay (HOSPITAL_COMMUNITY): Payer: Medicare HMO

## 2019-11-13 DIAGNOSIS — I6521 Occlusion and stenosis of right carotid artery: Secondary | ICD-10-CM | POA: Diagnosis present

## 2019-11-13 DIAGNOSIS — I10 Essential (primary) hypertension: Secondary | ICD-10-CM

## 2019-11-13 DIAGNOSIS — D62 Acute posthemorrhagic anemia: Secondary | ICD-10-CM | POA: Diagnosis not present

## 2019-11-13 DIAGNOSIS — Z823 Family history of stroke: Secondary | ICD-10-CM

## 2019-11-13 DIAGNOSIS — E785 Hyperlipidemia, unspecified: Secondary | ICD-10-CM | POA: Diagnosis present

## 2019-11-13 DIAGNOSIS — I63231 Cerebral infarction due to unspecified occlusion or stenosis of right carotid arteries: Secondary | ICD-10-CM

## 2019-11-13 HISTORY — DX: Acute posthemorrhagic anemia: D62

## 2019-11-13 HISTORY — DX: Hyperlipidemia, unspecified: E78.5

## 2019-11-13 HISTORY — DX: Family history of stroke: Z82.3

## 2019-11-13 HISTORY — DX: Occlusion and stenosis of right carotid artery: I65.21

## 2019-11-13 HISTORY — DX: Essential (primary) hypertension: I10

## 2019-11-13 MED ORDER — TICAGRELOR 90 MG PO TABS: 90.0000 mg | ORAL_TABLET | Freq: Two times a day (BID) | ORAL | 2 refills | Status: DC

## 2019-11-13 MED ORDER — ATORVASTATIN CALCIUM 40 MG PO TABS: 40.0000 mg | ORAL_TABLET | Freq: Every day | ORAL | 2 refills | Status: DC

## 2019-11-13 MED ORDER — ASPIRIN 81 MG PO CHEW: 81.0000 mg | CHEWABLE_TABLET | Freq: Every day | ORAL | Status: DC

## 2019-11-13 NOTE — Progress Notes (Signed)
Physical Therapy Treatment Patient Details Name: Ryan Hodge MRN: [id number] DOB: May 10, 1935 Today's Date: 11/13/2019    History of Present Illness Patient is an 84 y/o male admitted with L side weakness s/p TPA and found to have occluded R carotid with ongoing symptoms so decision was made to undergo revascularization of R ICA.  PMH positive for HTN and COPD.    PT Comments    Patient progressing with mobility this session more stable and able to ambulate in hallway with cane and supervision.  One LOB in room turning with min A for recovery.  Discussed footwear and using cane/walker in the home, taking his time and need for initial supervision with daughter.  Feel HHPT indicated to address balance and return to independent ambulation.    Follow Up Recommendations  Home health PT     Equipment Recommendations  None recommended by PT    Recommendations for Other Services       Precautions / Restrictions Precautions Precautions: Fall    Mobility  Bed Mobility Overal bed mobility: Modified Independent                Transfers Overall transfer level: Needs assistance Equipment used: Straight cane Transfers: Sit to/from Stand Sit to Stand: Supervision         General transfer comment: up from EOB S for safety with lines, use of cane  Ambulation/Gait Ambulation/Gait assistance: Supervision Gait Distance (Feet): 400 Feet Assistive device: Straight cane Gait Pattern/deviations: Step-through pattern;Drifts right/left     General Gait Details: using cane L UE with good technique and S assist for safety, no LOB walking in hallway, one LOB in room turning to pass bed with min A for recovery,   Stairs             Wheelchair Mobility    Modified Rankin (Stroke Patients Only) Modified Rankin (Stroke Patients Only) Pre-Morbid Rankin Score: No significant disability Modified Rankin: Moderate disability     Balance Overall balance assessment: Needs  assistance   Sitting balance-Leahy Scale: Good       Standing balance-Leahy Scale: Good                              Cognition Arousal/Alertness: Awake/alert Behavior During Therapy: WFL for tasks assessed/performed Overall Cognitive Status: No family/caregiver present to determine baseline cognitive functioning                                 General Comments: mild deficits per SLP could be baseline      Exercises      General Comments General comments (skin integrity, edema, etc.): noted ecchymosis R lateral trunk, denies pain      Pertinent Vitals/Pain Pain Assessment: No/denies pain    Home Living                      Prior Function            PT Goals (current goals can now be found in the care plan section) Progress towards PT goals: Progressing toward goals    Frequency    Min 4X/week      PT Plan      Co-evaluation              AM-PAC PT "6 Clicks" Mobility   Outcome Measure  Help needed turning from your back to  your side while in a flat bed without using bedrails?: None Help needed moving from lying on your back to sitting on the side of a flat bed without using bedrails?: None Help needed moving to and from a bed to a chair (including a wheelchair)?: None Help needed standing up from a chair using your arms (e.g., wheelchair or bedside chair)?: None Help needed to walk in hospital room?: A Little Help needed climbing 3-5 steps with a railing? : A Little 6 Click Score: 22    End of Session   Activity Tolerance: Patient tolerated treatment well Patient left: in chair;with call bell/phone within reach   PT Visit Diagnosis: Other abnormalities of gait and mobility (R26.89)     Time: 7510-2585 PT Time Calculation (min) (ACUTE ONLY): 18 min  Charges:  $Gait Training: 8-22 mins                     Magda Kiel, PT Acute Rehabilitation  Services Pager:[phone] [phone] 11/13/2019    Reginia Naas 11/13/2019, 10:53 AM

## 2019-11-13 NOTE — Care Management Important Message (Signed)
Important Message  Patient Details  Name: Ryan Hodge MRN: [id number] Date of Birth: May 15, 1935   Medicare Important Message Given:  Other (see comment)  Patient has a room precaution in place and not hear when I called to get a verbal consent.  Will mail to home address.    Mitch Arquette 11/13/2019, 2:01 PM

## 2019-11-13 NOTE — Discharge Summary (Addendum)
Stroke Discharge Summary  Patient ID: Ryan Hodge   MRN: [id number]      DOB: [birth year]  Date of Admission: 11/11/2019 Date of Discharge: 11/13/2019  Attending Physician:  Rosalin Hawking, MD, Stroke MD Consultant(s):   Olene Craven) Estanislado Pandy, MD (Interventional Neuroradiologist) Patient's PCP:  Street, Sharon Mt, MD  DISCHARGE DIAGNOSIS:  Principal Problem:   Cerebral infarction R MCA due to occlusion of right carotid artery (HCC) s/p tPA and revascularization w/ R ICA stent placement Active Problems:   ICAO (internal carotid artery occlusion), right s/p stent   Essential hypertension   Hyperlipidemia   Family hx-stroke   Acute blood loss anemia   Allergies as of 11/13/2019   No Known Allergies     Medication List    STOP taking these medications   GOODYS BODY PAIN PO     TAKE these medications   amLODipine 10 MG tablet Commonly known as: NORVASC Take 10 mg by mouth daily.   aspirin 81 MG chewable tablet Chew 1 tablet (81 mg total) by mouth daily. Start taking on: November 14, 2019   atorvastatin 40 MG tablet Commonly known as: LIPITOR Take 1 tablet (40 mg total) by mouth daily. Start taking on: November 14, 2019   ticagrelor 90 MG Tabs tablet Commonly known as: BRILINTA Take 1 tablet (90 mg total) by mouth 2 (two) times daily.            Durable Medical Equipment  (From admission, onward)         Start     Ordered   11/13/19 1244  For home use only DME Walker rolling  Once       Question Answer Comment  Walker: With 5 Inch Wheels   Patient needs a walker to treat with the following condition Weakness generalized      11/13/19 1243           Discharge Care Instructions  (From admission, onward)         Start     Ordered   11/13/19 0000  Discharge wound care:       Comments: Wound care per d/c paperwork directions   11/13/19 1438          LABORATORY STUDIES CBC    Component Value Date/Time   WBC 8.1 11/12/2019 0445    RBC 2.93 (L) 11/12/2019 0445   HGB 9.4 (L) 11/12/2019 0445   HCT 29.1 (L) 11/12/2019 0445   PLT 195 11/12/2019 0445   MCV 99.3 11/12/2019 0445   MCH 32.1 11/12/2019 0445   MCHC 32.3 11/12/2019 0445   RDW 14.4 11/12/2019 0445   CMP    Component Value Date/Time   NA 137 11/12/2019 0445   K 4.7 11/12/2019 0445   CL 108 11/12/2019 0445   CO2 21 (L) 11/12/2019 0445   GLUCOSE 119 (H) 11/12/2019 0445   BUN 20 11/12/2019 0445   CREATININE 1.24 11/12/2019 0445   CALCIUM 8.4 (L) 11/12/2019 0445   PROT 5.9 (L) 11/11/2019 1440   ALBUMIN 2.8 (L) 11/11/2019 1440   AST 14 (L) 11/11/2019 1440   ALT 11 11/11/2019 1440   ALKPHOS 64 11/11/2019 1440   BILITOT 0.4 11/11/2019 1440   GFRNONAA 54 (L) 11/12/2019 0445   GFRAA >60 11/12/2019 0445   Lipid Panel    Component Value Date/Time   CHOL 193 11/12/2019 0445   TRIG 71 11/12/2019 0445   HDL 51 11/12/2019 0445   CHOLHDL 3.8 11/12/2019 0445  VLDL 14 11/12/2019 0445   LDLCALC 128 (H) 11/12/2019 0445   HgbA1C  Lab Results  Component Value Date   HGBA1C 6.1 (H) 11/12/2019    SIGNIFICANT DIAGNOSTIC STUDIES CT HEAD WO CONTRAST  Result Date: 11/11/2019 CLINICAL DATA:  84 year old male code stroke presentation today with right ICA occlusion. Status post endovascular revascularization of right ICA and MCA. EXAM: CT HEAD WITHOUT CONTRAST TECHNIQUE: Contiguous axial images were obtained from the base of the skull through the vertex without intravenous contrast. COMPARISON:  Post treatment head CT 1302 hours today, and earlier. FINDINGS: Brain: Area of hypodensity in the right inferior frontal gyrus (coronal image 25) seems unchanged from the presentation CT at 0834 hours today. And elsewhere gray-white matter differentiation appears stable. No cortically based acute infarct identified. No intracranial mass effect. No acute intracranial hemorrhage identified. Vascular: Persistent residual intravascular contrast. Skull: Stable.  No acute osseous  abnormality identified. Sinuses/Orbits: Visualized paranasal sinuses and mastoids are stable and well pneumatized. Other: No acute orbit or scalp soft tissue findings. IMPRESSION: 1. No acute infarct identified by CT. An area of hypodensity in the right inferior frontal gyrus seems to be new since 2019, but unchanged from the initial head CT this morning - favoring chronic encephalomalacia. 2. No intracranial hemorrhage identified. Electronically Signed   By: Genevie Ann M.D.   On: 11/11/2019 16:55   CT HEAD WO CONTRAST  Result Date: 11/11/2019 CLINICAL DATA:  Stroke, follow-up.  Follow-up stroke intervention. EXAM: CT HEAD WITHOUT CONTRAST TECHNIQUE: Contiguous axial images were obtained from the base of the skull through the vertex without intravenous contrast. COMPARISON:  CT head performed earlier the same day 11/11/2019, CT angiogram head/neck performed earlier the same day 11/11/2019. FINDINGS: Brain: Redemonstrated mild generalized parenchymal atrophy. There is no evidence of acute infarction. Unchanged small focus of hyperdensity within the posterior right lentiform nucleus likely reflecting vascular enhancement. No acute intracranial hemorrhage is identified. Stable, mild ill-defined hypoattenuation within the cerebral white matter which is nonspecific, but consistent with chronic small vessel ischemic disease. No extra-axial fluid collection. No evidence of intracranial mass. No midline shift. Vascular: Residual circulating contrast limits evaluation for hyperdense vessels. Atherosclerotic calcifications. Skull: Normal. Negative for fracture or focal lesion. Sinuses/Orbits: No acute orbital abnormality. No significant paranasal sinus disease or mastoid effusion at the imaged levels. Partially visualized life-support tubes. IMPRESSION: Residual circulating contrast. No CT evidence of acute infarct or acute intracranial hemorrhage. Stable generalized parenchymal atrophy and chronic small vessel ischemic  disease. Electronically Signed   By: Kellie Simmering DO   On: 11/11/2019 13:26   MR ANGIO HEAD WO CONTRAST  Result Date: 11/12/2019 CLINICAL DATA:  Stroke follow-up.  Left-sided weakness EXAM: MRI HEAD WITHOUT CONTRAST MRA HEAD WITHOUT CONTRAST TECHNIQUE: Multiplanar, multiecho pulse sequences of the brain and surrounding structures were obtained without intravenous contrast. Angiographic images of the head were obtained using MRA technique without contrast. COMPARISON:  Head CT from yesterday.  CT and CTA from yesterday FINDINGS: MRI HEAD FINDINGS Brain: Overall small volume acute cortical infarction along the right frontal parietal convexity and to a lesser extent at the right frontal operculum. There is good correlation with preceding CT perfusion. No acute hemorrhage. Perforator infarct at the right corona radiata that is isointense on ADC map. No mass, hydrocephalus, or extra-axial collection. Age normal cerebral volume. Mild chronic small vessel ischemia in the periventricular white matter. Vascular: See below Skull and upper cervical spine: Negative for marrow lesion Sinuses/Orbits: No acute finding.  Right cataract resection MRA  HEAD FINDINGS Limited by moderate motion artifact. Robust flow in the bilateral carotid and vertebral arteries. Moderate narrowing at the right vertebrobasilar junction. There is atheromatous change to bilateral MCA vessels which is overestimated on this motion degraded study. Reference prior CTA. No detected aneurysm or vascular malformation. IMPRESSION: Brain MRI: 1. Patchy acute infarcts in the right MCA territory, most confluent at the frontal parietal junction. There is good correlation with previous CT perfusion. 2. Subacute infarct at the right corona radiata. Intracranial MRA: 1. Symmetric flow in the stented right ICA. 2. Intracranial atheromatous stenoses are better assessed on the preceding CTA given the degree of motion artifact. There is a redemonstrated moderate  narrowing of the right vertebrobasilar junction Electronically Signed   By: Monte Fantasia M.D.   On: 11/12/2019 10:34   MR BRAIN WO CONTRAST  Result Date: 11/12/2019 CLINICAL DATA:  Stroke follow-up.  Left-sided weakness EXAM: MRI HEAD WITHOUT CONTRAST MRA HEAD WITHOUT CONTRAST TECHNIQUE: Multiplanar, multiecho pulse sequences of the brain and surrounding structures were obtained without intravenous contrast. Angiographic images of the head were obtained using MRA technique without contrast. COMPARISON:  Head CT from yesterday.  CT and CTA from yesterday FINDINGS: MRI HEAD FINDINGS Brain: Overall small volume acute cortical infarction along the right frontal parietal convexity and to a lesser extent at the right frontal operculum. There is good correlation with preceding CT perfusion. No acute hemorrhage. Perforator infarct at the right corona radiata that is isointense on ADC map. No mass, hydrocephalus, or extra-axial collection. Age normal cerebral volume. Mild chronic small vessel ischemia in the periventricular white matter. Vascular: See below Skull and upper cervical spine: Negative for marrow lesion Sinuses/Orbits: No acute finding.  Right cataract resection MRA HEAD FINDINGS Limited by moderate motion artifact. Robust flow in the bilateral carotid and vertebral arteries. Moderate narrowing at the right vertebrobasilar junction. There is atheromatous change to bilateral MCA vessels which is overestimated on this motion degraded study. Reference prior CTA. No detected aneurysm or vascular malformation. IMPRESSION: Brain MRI: 1. Patchy acute infarcts in the right MCA territory, most confluent at the frontal parietal junction. There is good correlation with previous CT perfusion. 2. Subacute infarct at the right corona radiata. Intracranial MRA: 1. Symmetric flow in the stented right ICA. 2. Intracranial atheromatous stenoses are better assessed on the preceding CTA given the degree of motion artifact.  There is a redemonstrated moderate narrowing of the right vertebrobasilar junction Electronically Signed   By: Monte Fantasia M.D.   On: 11/12/2019 10:34   IR INTRAVSC STENT CERV CAROTID W/O EMB-PROT MOD SED  Result Date: 11/13/2019 INDICATION: Left-sided weakness with left-sided neglect, and visual field deficit. CT angiogram revealing occluded right internal carotid artery proximally. EXAM: 1. EMERGENT LARGE VESSEL OCCLUSION THROMBOLYSIS (anterior CIRCULATION) COMPARISON:  CT angiogram of the head and neck of November 11, 2019. MEDICATIONS: Ancef 2 g IV antibiotic was administered within 1 hour of the procedure. ANESTHESIA/SEDATION: General anesthesia CONTRAST:  Isovue 300 approximately 60 mL FLUOROSCOPY TIME:  Fluoroscopy Time: 25 minutes 30 seconds (706 mGy). COMPLICATIONS: None immediate. TECHNIQUE: Following a full explanation of the procedure along with the potential associated complications, an informed witnessed consent was obtained the patient's daughter. The risks of intracranial hemorrhage of 10%, worsening neurological deficit, ventilator dependency, death and inability to revascularize were all reviewed in detail with the patient's daughter. The patient was then put under general anesthesia by the Department of Anesthesiology at [facility]. The right groin was prepped and draped in the  usual sterile fashion. Thereafter using modified Seldinger technique, transfemoral access into the right common femoral artery was obtained without difficulty. Over a 0.035 inch guidewire a 8 French 25 cm Pinnacle sheath was inserted. Through this, and also over a 0.035 inch guidewire a 5 [place] JB 1 catheter was advanced to the aortic arch region and selectively positioned in the innominate artery, and right common carotid artery. FINDINGS: The innominate arteriogram demonstrates the origin of the right subclavian artery and the right common carotid artery to be widely patent. The origin of the right vertebral  artery is widely patent. The vessel is seen to opacify to the cranial skull base. The right common carotid arteriogram demonstrates the right external carotid artery and its major branches to be widely patent. The right internal carotid artery at the bulb has a stump with no discrete evidence of an angiographic string sign extracranially. More distally partial reconstitution of the right internal carotid artery cavernous segment in an antegrade manner is seen via the ipsilateral ophthalmic artery from the external and lacrimal branches. Retrograde flow is seen more proximally to the petrous cavernous junction. PROCEDURE: The 5 French diagnostic JB 1 catheter in the right common carotid artery was exchanged over a 0.035 inch 300 cm Rosen exchange guidewire for an 087 balloon guide catheter. Guidewire was removed. Good aspiration obtained from the hub of the balloon guide catheter. A gentle control arteriogram performed through this just to the right common carotid bifurcation demonstrates no change in the internal carotid artery. Over a 0.014 inch standard Synchro micro guidewire, an 021 microcatheter was advanced without difficulty through the occluded right internal carotid artery and advanced to the horizontal petrous segment followed by the microcatheter. The guidewire was removed. Good aspiration obtained from the hub of the microcatheter. This in turn was then exchanged for a 300 cm 014 inch Zoom exchange micro guidewire with a J configuration at its tip. A gentle control arteriogram performed through the right common carotid artery demonstrates no change in the occluded right internal carotid artery proximally. A 4 mm x 30 mm 14 Viatrac angioplasty balloon catheter was then prepped and purged with heparinized saline infusion retrogradely. Using the rapid exchange technique, balloon was advanced to the right internal carotid artery at its origin. The proximal and distal markers were aligned appropriately.  Thereafter a slow control inflation was performed using micro inflation syringe device via micro tubing to 8.4 atmospheres where it was maintained for approximately 1-1/2 minutes. Balloon was then deflated and retrieved proximally. Control arteriogram performed through the balloon guide catheter in the right common carotid artery now demonstrated significantly improved caliber and flow through the angioplastied segment more distally to the cranial skull base. Arteriogram centered intracranially demonstrated complete revascularization of the right middle cerebral artery with a small distal angular branch in the M3/M4 region showing partial occlusion with recanalization. Measurements were performed of the right internal carotid artery distal to the previous occlusion, and proximally the right common carotid artery. The length of the lesion was also measured. It was decided to proceed with placement of a 7/9 mm x 40 mm XACT stent. Stent delivery system was then prepped with heparinized saline infusion retrogradely. Thereafter using the rapid exchange technique, the stent delivery system was advanced to the angioplastied segment of the right internal carotid artery. The proximal and the distal markers of the stent were then positioned appropriately. Thereafter, the stent was delivered without difficulty in the usual manner. The delivery apparatus was retrieved and removed. A control  arteriogram performed through the balloon guide catheter in the right common carotid artery distally demonstrated excellent apposition distally and proximally with near complete expansion of the right internal carotid proximally. Control arteriograms were then performed at 15 minutes and 30 minutes post stent deployment. These continued to demonstrate excellent flow through the stented segment of the right internal carotid artery into the skull base. The right middle cerebral artery distribution now demonstrated further improvement in the  revascularization of the angular branch M4 region. Prior to placement of the stent, the patient had been given a loading dose of Brilinta 180 mg and 81 mg of aspirin via an orogastric tube. Right after placement of the stent, the patient was bolused with a cangrelor followed by a low-dose infusion of 2 milligrams/kg per minute over 4 hours. Exchange micro guidewire was removed. A final control arteriogram continued to show excellent flow through the right internal carotid artery stented segment and distally into the middle cerebral artery and the right anterior cerebral artery distributions. A TICI 2C revascularization of the right MCA territory was achieved. The right groin 8 French sheath was replaced with an 8 French Angio-Seal closure device for hemostasis. The right groin appeared soft. Distal pulses remained Dopplerable in the dorsalis pedis, and the posterior tibial regions bilaterally. A CT of the brain was obtained in the CT scan suite. This demonstrated no evidence of hemorrhage, mass effect or midline shift. The patient was then transferred to the PACU for extubation and subsequently to neuro ICU for post revascularization management. IMPRESSION: Status post endovascular revascularization of occluded symptomatic right internal carotid artery proximally with stent assisted angioplasty with proximal flow arrest. Establishment of TICI 2C revascularization in the right MCA distribution. PLAN: Follow-up in the clinic 2-4 weeks post discharge. Electronically Signed   By: Luanne Bras M.D.   On: 11/12/2019 18:02   CT CEREBRAL PERFUSION W CONTRAST  Result Date: 11/11/2019 CLINICAL DATA:  Carotid occlusion EXAM: CT ANGIOGRAPHY HEAD AND NECK CT PERFUSION BRAIN TECHNIQUE: Multidetector CT imaging of the head was performed using the standard protocol without contrast. Multiphase CT imaging of the brain was performed following IV bolus contrast injection. Subsequent parametric perfusion maps were calculated  using RAPID software. CONTRAST:  24mL OMNIPAQUE IOHEXOL 350 MG/ML SOLN COMPARISON:  Earlier same day FINDINGS: CT HEAD FINDINGS Brain: There is no acute intracranial hemorrhage, mass effect, or edema. No new loss of gray-white differentiation. Ventricles are stable in size. Vascular: Residual intravascular contrast. Skull: Unremarkable. Sinuses/Orbits: No acute abnormality. Other: None. CT Brain Perfusion Findings: ASPECTS: 10 CBF (<30%) Volume: 63mL Perfusion (Tmax>6.0s) volume: 17mL Mismatch Volume: 74mL Infarction Location: Right parietal IMPRESSION: No acute intracranial hemorrhage or new loss of gray-white differentiation. Perfusion software demonstrates 3 mL core infarction and 13 mL penumbra in the right parietal lobe. However, the source data demonstrates motion degradation. Electronically Signed   By: Macy Mis M.D.   On: 11/11/2019 11:00   ECHOCARDIOGRAM COMPLETE  Result Date: 11/12/2019    ECHOCARDIOGRAM REPORT   Patient Name:   Ryan Hodge Date of Exam: 11/12/2019 Medical Rec #:  [id number]             Height:       74.0 in Accession #:    [id number]            Weight:       153.7 lb Date of Birth:  [birth year]            BSA:  1.943 m Patient Age:    61 years              BP:           111/59 mmHg Patient Gender: M                     HR:           68 bpm. Exam Location:  Inpatient Procedure: 2D Echo, Cardiac Doppler, Color Doppler and Intracardiac            Opacification Agent Indications:    Stroke  History:        Patient has no prior history of Echocardiogram examinations.                 COPD; Risk Factors:Hypertension.  Sonographer:    Clayton Lefort RDCS (AE) Referring Phys: [phone] [facility]  Sonographer Comments: Technically challenging study due to limited acoustic windows, Technically difficult study due to poor echo windows, suboptimal parasternal window and suboptimal apical window. Image acquisition challenging due to COPD. Poor soundwave transmission.  IMPRESSIONS  1. Left ventricular ejection fraction, by estimation, is 50 to 55%. The left ventricle has low normal function. The left ventricle has no regional wall motion abnormalities. Left ventricular diastolic parameters were normal.  2. Right ventricular systolic function is normal. The right ventricular size is normal. Tricuspid regurgitation signal is inadequate for assessing PA pressure.  3. The mitral valve is normal in structure. No evidence of mitral valve regurgitation.  4. The aortic valve was not well visualized. Aortic valve regurgitation is not visualized. No aortic stenosis is present.  5. Mobile filamentous structure in right atrium attached to interatrial septum likely representing Chiari network  6. The inferior vena cava is normal in size with greater than 50% respiratory variability, suggesting right atrial pressure of 3 mmHg. FINDINGS  Left Ventricle: Left ventricular ejection fraction, by estimation, is 50 to 55%. The left ventricle has low normal function. The left ventricle has no regional wall motion abnormalities. Definity contrast agent was given IV to delineate the left ventricular endocardial borders. The left ventricular internal cavity size was normal in size. There is no left ventricular hypertrophy. Left ventricular diastolic parameters were normal. Right Ventricle: The right ventricular size is normal. No increase in right ventricular wall thickness. Right ventricular systolic function is normal. Tricuspid regurgitation signal is inadequate for assessing PA pressure. Left Atrium: Left atrial size was normal in size. Right Atrium: Right atrial size was normal in size. Prominent Chiari network. Pericardium: There is no evidence of pericardial effusion. Mitral Valve: The mitral valve is normal in structure. No evidence of mitral valve regurgitation. Tricuspid Valve: The tricuspid valve is normal in structure. Tricuspid valve regurgitation is not demonstrated. Aortic Valve: The aortic  valve was not well visualized. Aortic valve regurgitation is not visualized. No aortic stenosis is present. Aortic valve mean gradient measures 3.0 mmHg. Aortic valve peak gradient measures 7.0 mmHg. Aortic valve area, by VTI measures 2.16 cm. Pulmonic Valve: The pulmonic valve was not well visualized. Pulmonic valve regurgitation is not visualized. Aorta: The aortic root and ascending aorta are structurally normal, with no evidence of dilitation. Venous: The inferior vena cava is normal in size with greater than 50% respiratory variability, suggesting right atrial pressure of 3 mmHg. IAS/Shunts: No atrial level shunt detected by color flow Doppler.  LEFT VENTRICLE PLAX 2D LVIDd:         5.00 cm  Diastology LVIDs:  3.90 cm  LV e' lateral:   8.38 cm/s LV PW:         1.00 cm  LV E/e' lateral: 7.2 LV IVS:        0.90 cm  LV e' medial:    10.20 cm/s LVOT diam:     1.90 cm  LV E/e' medial:  5.9 LV SV:         55 LV SV Index:   28 LVOT Area:     2.84 cm  RIGHT VENTRICLE             IVC RV S prime:     12.90 cm/s  IVC diam: 1.80 cm TAPSE (M-mode): 2.3 cm LEFT ATRIUM           Index LA diam:      3.30 cm 1.70 cm/m LA Vol (A4C): 35.6 ml 18.32 ml/m  AORTIC VALVE AV Area (Vmax):    1.87 cm AV Area (Vmean):   1.91 cm AV Area (VTI):     2.16 cm AV Vmax:           132.00 cm/s AV Vmean:          85.800 cm/s AV VTI:            0.255 m AV Peak Grad:      7.0 mmHg AV Mean Grad:      3.0 mmHg LVOT Vmax:         87.10 cm/s LVOT Vmean:        57.800 cm/s LVOT VTI:          0.194 m LVOT/AV VTI ratio: 0.76  AORTA Ao Root diam: 3.00 cm Ao Asc diam:  3.40 cm MITRAL VALVE MV Area (PHT): 2.22 cm    SHUNTS MV Decel Time: 342 msec    Systemic VTI:  0.19 m MV E velocity: 60.00 cm/s  Systemic Diam: 1.90 cm MV A velocity: 67.70 cm/s MV E/A ratio:  0.89 Oswaldo Milian MD Electronically signed by Oswaldo Milian MD Signature Date/Time: 11/12/2019/11:50:19 PM    Final    IR PERCUTANEOUS ART THROMBECTOMY/INFUSION  INTRACRANIAL INC DIAG ANGIO  Result Date: 11/13/2019 INDICATION: Left-sided weakness with left-sided neglect, and visual field deficit. CT angiogram revealing occluded right internal carotid artery proximally. EXAM: 1. EMERGENT LARGE VESSEL OCCLUSION THROMBOLYSIS (anterior CIRCULATION) COMPARISON:  CT angiogram of the head and neck of November 11, 2019. MEDICATIONS: Ancef 2 g IV antibiotic was administered within 1 hour of the procedure. ANESTHESIA/SEDATION: General anesthesia CONTRAST:  Isovue 300 approximately 60 mL FLUOROSCOPY TIME:  Fluoroscopy Time: 25 minutes 30 seconds (706 mGy). COMPLICATIONS: None immediate. TECHNIQUE: Following a full explanation of the procedure along with the potential associated complications, an informed witnessed consent was obtained the patient's daughter. The risks of intracranial hemorrhage of 10%, worsening neurological deficit, ventilator dependency, death and inability to revascularize were all reviewed in detail with the patient's daughter. The patient was then put under general anesthesia by the Department of Anesthesiology at [facility]. The right groin was prepped and draped in the usual sterile fashion. Thereafter using modified Seldinger technique, transfemoral access into the right common femoral artery was obtained without difficulty. Over a 0.035 inch guidewire a 8 French 25 cm Pinnacle sheath was inserted. Through this, and also over a 0.035 inch guidewire a 5 [place] JB 1 catheter was advanced to the aortic arch region and selectively positioned in the innominate artery, and right common carotid artery. FINDINGS: The innominate arteriogram demonstrates the origin of  the right subclavian artery and the right common carotid artery to be widely patent. The origin of the right vertebral artery is widely patent. The vessel is seen to opacify to the cranial skull base. The right common carotid arteriogram demonstrates the right external carotid artery and its major  branches to be widely patent. The right internal carotid artery at the bulb has a stump with no discrete evidence of an angiographic string sign extracranially. More distally partial reconstitution of the right internal carotid artery cavernous segment in an antegrade manner is seen via the ipsilateral ophthalmic artery from the external and lacrimal branches. Retrograde flow is seen more proximally to the petrous cavernous junction. PROCEDURE: The 5 French diagnostic JB 1 catheter in the right common carotid artery was exchanged over a 0.035 inch 300 cm Rosen exchange guidewire for an 087 balloon guide catheter. Guidewire was removed. Good aspiration obtained from the hub of the balloon guide catheter. A gentle control arteriogram performed through this just to the right common carotid bifurcation demonstrates no change in the internal carotid artery. Over a 0.014 inch standard Synchro micro guidewire, an 021 microcatheter was advanced without difficulty through the occluded right internal carotid artery and advanced to the horizontal petrous segment followed by the microcatheter. The guidewire was removed. Good aspiration obtained from the hub of the microcatheter. This in turn was then exchanged for a 300 cm 014 inch Zoom exchange micro guidewire with a J configuration at its tip. A gentle control arteriogram performed through the right common carotid artery demonstrates no change in the occluded right internal carotid artery proximally. A 4 mm x 30 mm 14 Viatrac angioplasty balloon catheter was then prepped and purged with heparinized saline infusion retrogradely. Using the rapid exchange technique, balloon was advanced to the right internal carotid artery at its origin. The proximal and distal markers were aligned appropriately. Thereafter a slow control inflation was performed using micro inflation syringe device via micro tubing to 8.4 atmospheres where it was maintained for approximately 1-1/2 minutes.  Balloon was then deflated and retrieved proximally. Control arteriogram performed through the balloon guide catheter in the right common carotid artery now demonstrated significantly improved caliber and flow through the angioplastied segment more distally to the cranial skull base. Arteriogram centered intracranially demonstrated complete revascularization of the right middle cerebral artery with a small distal angular branch in the M3/M4 region showing partial occlusion with recanalization. Measurements were performed of the right internal carotid artery distal to the previous occlusion, and proximally the right common carotid artery. The length of the lesion was also measured. It was decided to proceed with placement of a 7/9 mm x 40 mm XACT stent. Stent delivery system was then prepped with heparinized saline infusion retrogradely. Thereafter using the rapid exchange technique, the stent delivery system was advanced to the angioplastied segment of the right internal carotid artery. The proximal and the distal markers of the stent were then positioned appropriately. Thereafter, the stent was delivered without difficulty in the usual manner. The delivery apparatus was retrieved and removed. A control arteriogram performed through the balloon guide catheter in the right common carotid artery distally demonstrated excellent apposition distally and proximally with near complete expansion of the right internal carotid proximally. Control arteriograms were then performed at 15 minutes and 30 minutes post stent deployment. These continued to demonstrate excellent flow through the stented segment of the right internal carotid artery into the skull base. The right middle cerebral artery distribution now demonstrated further improvement in the  revascularization of the angular branch M4 region. Prior to placement of the stent, the patient had been given a loading dose of Brilinta 180 mg and 81 mg of aspirin via an  orogastric tube. Right after placement of the stent, the patient was bolused with a cangrelor followed by a low-dose infusion of 2 milligrams/kg per minute over 4 hours. Exchange micro guidewire was removed. A final control arteriogram continued to show excellent flow through the right internal carotid artery stented segment and distally into the middle cerebral artery and the right anterior cerebral artery distributions. A TICI 2C revascularization of the right MCA territory was achieved. The right groin 8 French sheath was replaced with an 8 French Angio-Seal closure device for hemostasis. The right groin appeared soft. Distal pulses remained Dopplerable in the dorsalis pedis, and the posterior tibial regions bilaterally. A CT of the brain was obtained in the CT scan suite. This demonstrated no evidence of hemorrhage, mass effect or midline shift. The patient was then transferred to the PACU for extubation and subsequently to neuro ICU for post revascularization management. IMPRESSION: Status post endovascular revascularization of occluded symptomatic right internal carotid artery proximally with stent assisted angioplasty with proximal flow arrest. Establishment of TICI 2C revascularization in the right MCA distribution. PLAN: Follow-up in the clinic 2-4 weeks post discharge. Electronically Signed   By: Luanne Bras M.D.   On: 11/12/2019 18:02   CT HEAD CODE STROKE WO CONTRAST  Result Date: 11/11/2019 CLINICAL DATA:  Carotid occlusion EXAM: CT ANGIOGRAPHY HEAD AND NECK CT PERFUSION BRAIN TECHNIQUE: Multidetector CT imaging of the head was performed using the standard protocol without contrast. Multiphase CT imaging of the brain was performed following IV bolus contrast injection. Subsequent parametric perfusion maps were calculated using RAPID software. CONTRAST:  71mL OMNIPAQUE IOHEXOL 350 MG/ML SOLN COMPARISON:  Earlier same day FINDINGS: CT HEAD FINDINGS Brain: There is no acute intracranial  hemorrhage, mass effect, or edema. No new loss of gray-white differentiation. Ventricles are stable in size. Vascular: Residual intravascular contrast. Skull: Unremarkable. Sinuses/Orbits: No acute abnormality. Other: None. CT Brain Perfusion Findings: ASPECTS: 10 CBF (<30%) Volume: 61mL Perfusion (Tmax>6.0s) volume: 67mL Mismatch Volume: 25mL Infarction Location: Right parietal IMPRESSION: No acute intracranial hemorrhage or new loss of gray-white differentiation. Perfusion software demonstrates 3 mL core infarction and 13 mL penumbra in the right parietal lobe. However, the source data demonstrates motion degradation. Electronically Signed   By: Macy Mis M.D.   On: 11/11/2019 11:00   VAS US CAROTID  Result Date: 11/13/2019 Carotid Arterial Duplex Study Indications:       Right stent and Right ICA stent. Risk Factors:      Hypertension, hyperlipidemia. Comparison Study:  No prior studies. Performing Technologist: Oliver Hum RVT  Examination Guidelines: A complete evaluation includes B-mode imaging, spectral Doppler, color Doppler, and power Doppler as needed of all accessible portions of each vessel. Bilateral testing is considered an integral part of a complete examination. Limited examinations for reoccurring indications may be performed as noted.  Right Carotid Findings: +----------+--------+--------+--------+------------------+--------+           PSV cm/sEDV cm/sStenosisPlaque DescriptionComments +----------+--------+--------+--------+------------------+--------+ CCA Prox  86      22                                         +----------+--------+--------+--------+------------------+--------+ CCA Distal88      23                                         +----------+--------+--------+--------+------------------+--------+  ICA Prox  55      22                                Stent    +----------+--------+--------+--------+------------------+--------+ ICA Mid   108     35                                 Stent    +----------+--------+--------+--------+------------------+--------+ ICA Distal104     32                                Stent    +----------+--------+--------+--------+------------------+--------+ ECA       91      14                                         +----------+--------+--------+--------+------------------+--------+ +----------+--------+-------+--------+-------------------+           PSV cm/sEDV cmsDescribeArm Pressure (mmHG) +----------+--------+-------+--------+-------------------+ [id number]                                         +----------+--------+-------+--------+-------------------+ +---------+--------+--+--------+--+ VertebralPSV cm/s32EDV cm/s10 +---------+--------+--+--------+--+  Right Stent(s): +---------------+---+--++++ Proximal Stent 55 22 +---------------+---+--++++ Mid Stent      10835 +---------------+---+--++++ Distal Stent   10432 +---------------+---+--++++ Distal to Stent72 26 +---------------+---+--++++    Summary: Right Carotid: Less than fifty percent ICA stenosis in the presence of a stent. Vertebrals: Right vertebral artery demonstrates antegrade flow. *See table(s) above for measurements and observations.     Preliminary       HISTORY OF PRESENT ILLNESS Ryan Hodge is a 84 y.o. male who was last seen well at 8 AM on 11/11/2019 and then subsequently noticed to have problems with his left side.  He was taken emergently to [facility] where he was given IV TPA.  CT angiogram there revealed an occluded right carotid.  Due to the concern that the occluded carotid was likely acute in the setting of his acute symptoms, he was brought emergently to [facility] where CT perfusion was obtained.  Though with T-max greater than six, the right hemisphere did not look significantly hypoperfused, with a T-max greater than four there was significant perfusion deficit in that  side.  He was waxing and waning with his left arm paresis therefore after discussion with Dr. Estanislado Pandy of neuro interventional radiology, the decision was made to proceed with acute carotid stenting. At baseline, he is quite hard of hearing, but otherwise relatively independent.  He does take care of his own activities of daily living. Modified Rankin Scale: 0. NIHSS: Initially seven, improved to six.  HOSPITAL COURSE Ryan Hodge is a 84 y.o. male with history of COPD and HTN presenting to [facility] with L sided weakness where he received tPA 11/11/2019 at 0917. Found to have a R ICA occlusion with waxing and waning symptoms. Transferred to [facility] for acute stenting.  Stroke:   R MCA infarcts due to right ICA occlusion s/p tPA + IR w/ TICI2c revascularization of R ICA w/ stent placement, infarct secondary to large vessel disease source   Code Stroke  CT head 7/28 1100 No acute abnormality. ASPECTS 10.     CT perfusion R parietal lobe 70mL core, 45mL penumbra  Cerebral angio proximal R ICA occlusion w/ TICI 2c revascularization using stent assisted angioplasty   CT head 7/28 1655 no acute abnormality. Hypodensity R interior frontal gyrus new since 2019 but stable since am. No hemorrhage   MRI 7/29 1034 patchy R frontal parietal jxn infarcts. subacute R corona radiata infarct   MRA  Symmetric flow R ICA stent. R VBJ moderate narrowing.  Carotid doppler < 50% R ICA stenosis    2D Echo EF 50-55%. No source of embolus. RA Chiari network  LDL 128  HgbA1c 6.1  VTE prophylaxis - SCDs   No antithrombotic prior to admission, now on aspirin 81 mg daily and Brilinta (ticagrelor) 90 mg bid. Continue DAPT given stent placement  Therapy recommendations:  HH PT, HH OT  Disposition:  return home  Right ICA occlusion, likely acute  CTA showed right ICA occlusion  Stat post IR with right ICA stent  MRA showed symmetrical flow bilateral  ICA  Carotid doppler < 50% R ICA stenosis    On aspirin and Brilinta  Hypertension  Home meds:  norvasc 10  Treated w/ Cleviprex, now off  Resume home norvasc at d/c  Long-term BP goal normotensive  Hyperlipidemia  Home meds:  No statin   LDL 128, goal < 70  Added lipitor 40   Continue statin at discharge  Other Stroke Risk Factors  Advanced age  Family hx stroke (daughter)  Other Active Problems  Acute blood loss anemia s/p IR w/ compression to radial site. Hb 10.1->9.4   DISCHARGE EXAM Blood pressure (!) 140/85, pulse 80, temperature 98 F (36.7 C), temperature source Oral, resp. rate 22, height 6\' 2"  (1.88 m), weight 69.7 kg, SpO2 95 %. General - Well nourished, well developed, in no apparent distress.  Ophthalmologic - fundi not visualized due to noncooperation.  Cardiovascular - Regular rhythm and rate.  Mental Status -  Level of arousal and orientation to time, place, and person were intact. Language including expression, naming, repetition, comprehension was assessed and found intact.  Mild to moderate dysarthria due to poor denture  Cranial Nerves II - XII - II - Visual field intact OU. III, IV, VI - Extraocular movements intact. V - Facial sensation intact bilaterally. VII - Facial movement intact bilaterally. VIII - severe hard of hearing & vestibular intact bilaterally. X - Palate elevates symmetrically. XI - Chin turning & shoulder shrug intact bilaterally. XII - Tongue protrusion intact.  Motor Strength - The patient's strength was normal in all extremities and pronator drift was absent.  Bulk was normal and fasciculations were absent.   Motor Tone - Muscle tone was assessed at the neck and appendages and was normal.  Reflexes - The patient's reflexes were symmetrical in all extremities and he had no pathological reflexes.  Sensory - Light touch, temperature/pinprick were assessed and were symmetrical.    Coordination - The  patient had grossly intact movements in the hands with no ataxia or dysmetria.  Tremor was absent.  Gait and Station - deferred.  Discharge Diet   Regular thin liquids  DISCHARGE PLAN  Disposition:  Return home  Home health PT & OT   aspirin 81 mg daily and Brilinta (ticagrelor) 90 mg bid for secondary stroke prevention for at least 6 mos given stent placement  Ongoing stroke risk factor control by Primary Care Physician at time of discharge  Follow-up PCP Street, Sharon Mt, MD in 2 weeks.  Follow-up in [facility] in 4 weeks, office to schedule an appointment.   Follow-up Sanjeev Nicole [facility]) Estanislado Pandy, MD (Interventional Neuroradiologist) in 4 weeks, office to schedule an appointment.   40 minutes were spent preparing discharge.  Rosalin Hawking, MD PhD Stroke Neurology 11/13/2019 3:52 PM

## 2019-11-13 NOTE — Progress Notes (Signed)
Right carotid artery duplex has been completed. Preliminary results can be found in CV Proc through chart review.   11/13/19 12:26 PM Ryan Hodge RVT

## 2019-11-13 NOTE — Discharge Instructions (Signed)
Femoral Site Care °This sheet gives you information about how to care for yourself after your procedure. Your health care provider may also give you more specific instructions. If you have problems or questions, contact your health care provider. °What can I expect after the procedure? °After the procedure, it is common to have: °· Bruising that usually fades within 1-2 weeks. °· Tenderness at the site. °Follow these instructions at home: °Wound care °1. Follow instructions from your health care provider about how to take care of your insertion site. Make sure you: °? Wash your hands with soap and water before you change your bandage (dressing). If soap and water are not available, use hand sanitizer. °? Change your dressing as directed- pressure dressing removed 24 hours post-procedure (and switch for bandaid), bandaid removed 72 hours post-procedure °2. Do not take baths, swim, or use a hot tub for 7 days post-procedure. °3. You may shower 48 hours after the procedure or as told by your health care provider. °? Gently wash the site with plain soap and water. °? Pat the area dry with a clean towel. °? Do not rub the site. This may cause bleeding. °4. Check your femoral site every day for signs of infection. Check for: °? Redness, swelling, or pain. °? Fluid or blood. °? Warmth. °? Pus or a bad smell. °Activity °· Do not stoop, bend, or lift anything that is heavier than 10 lb (4.5 kg) for 2 weeks post-procedure. °· Do not drive self for 2 weeks post-procedure. °Contact a health care provider if you have: °· A fever or chills. °· You have redness, swelling, or pain around your insertion site. °Get help right away if: °· The catheter insertion area swells very fast. °· You pass out. °· You suddenly start to sweat or your skin gets clammy. °· The catheter insertion area is bleeding, and the bleeding does not stop when you hold steady pressure on the area. °· The area near or just beyond the catheter insertion site  becomes pale, cool, tingly, or numb. °These symptoms may represent a serious problem that is an emergency. Do not wait to see if the symptoms will go away. Get medical help right away. Call your local emergency services (911 in the [place] Do not drive yourself to the hospital. ° °This information is not intended to replace advice given to you by your health care provider. Make sure you discuss any questions you have with your health care provider. °Document Revised: 04/15/2017 Document Reviewed: 04/15/2017 °Elsevier Patient Education © 2020 [employer] °

## 2019-11-13 NOTE — TOC Transition Note (Addendum)
Transition of Care (TOC) - CM/SW Discharge Note Marvetta Gibbons RN,BSN Transitions of Care Unit 4NP (non trauma) - RN Case Manager See Treatment Team for direct Phone #   Patient Details  Name: Ryan Hodge MRN: [id number] Date of Birth: Aug 13, 1935  Transition of Care [facility]) CM/SW Contact:  Dawayne Patricia, RN Phone Number: 11/13/2019, 12:55 PM   Clinical Narrative:    Pt stable for probable transition home later today. Orders placed for [facility] needs and DME. CM spoke with pt at bedside to discuss [facility] needs and list provided for [facility] choice Per CMS guidelines from medicare.gov website with star ratings (copy placed in shadow chart). Per pt he does not have a preference for [facility] agency and defers choice to this writer to find an agency that will take his insurance and be able to provide services. Pt does state that he would like a RW for home as he has been using his daughter's. Address, phone # and PCP all confirmed with pt in epic. Per pt his daughter will provide transport home.  Call made to [facility] with [facility] for [facility] referral- referral has been accepted by [facility] branch.  Call made to [facility] with [employer] for DME need- RW to be delivered to room prior to discharge  Final next level of care: [place] Barriers to Discharge: No Barriers Identified   Patient Goals and CMS Choice Patient states their goals for this hospitalization and ongoing recovery are:: return home CMS Medicare.gov Compare Post Acute Care list provided to:: Patient Choice offered to / list presented to : Patient  Discharge Placement               Home with [facility]        Discharge Plan and Services   Discharge Planning Services: CM Consult Post Acute Care Choice: Home Health, Durable Medical Equipment          DME Arranged: [place] rolling DME Agency: [employer] Date DME Agency Contacted: 11/13/19 Time DME Agency Contacted: [id number] Representative spoke with at DME Agency: Thedore Mins HH Arranged:  PT, OT [place] Agency: [facility] Date [place]: 11/13/19 Time [place]: 66 Representative spoke with at [place]: [place] ([place]) Interventions     Readmission Risk Interventions Readmission Risk Prevention Plan 11/13/2019  Post Dischage Appt Complete  Medication Screening Complete  Transportation Screening Complete  Some recent data might be hidden

## 2019-11-13 NOTE — Evaluation (Signed)
Occupational Therapy Evaluation Patient Details Name: Ryan Hodge MRN: [id number] DOB: [birth year] Today's Date: 11/13/2019    History of Present Illness Patient is an 84 y/o male admitted with L side weakness s/p TPA and found to have occluded R carotid with ongoing symptoms so decision was made to undergo revascularization of R ICA.  PMH positive for HTN and COPD.   Clinical Impression   Pt admitted with the above deficits and demonstrates the below listed deficits.  He is able to perform ADLs at min guard assist level and lives with his daughter, who he reports can provide 24 hour assist at discharge.  He reports he was full independent PTA.  Recommend HHOT to ensure pt's independence in home environment with IADLs.     Follow Up Recommendations  Home health OT;Supervision - Intermittent    Equipment Recommendations  None recommended by OT    Recommendations for Other Services       Precautions / Restrictions Precautions Precautions: Fall      Mobility Bed Mobility Overal bed mobility: Modified Independent                Transfers Overall transfer level: Needs assistance Equipment used: Rolling walker (2 wheeled) Transfers: Sit to/from [facility] Sit to Stand: Supervision Stand pivot transfers: Supervision       General transfer comment: up from EOB S for safety with lines, use of cane    Balance Overall balance assessment: Needs assistance   Sitting balance-Leahy Scale: Good       Standing balance-Leahy Scale: Fair Standing balance comment: for static standing                            ADL either performed or assessed with clinical judgement   ADL Overall ADL's : Needs assistance/impaired Eating/Feeding: Independent   Grooming: Wash/dry hands;Wash/dry face;Oral care;Brushing hair;Min guard;Standing   Upper Body Bathing: Set up;Sitting   Lower Body Bathing: Min guard;Sit to/from stand   Upper Body Dressing  : Set up;Supervision/safety;Sitting   Lower Body Dressing: Min guard;Sit to/from stand   Toilet Transfer: Min guard;Ambulation;Comfort height toilet;RW Armed forces technical officer Details (indicate cue type and reason): verbal cues for hand placement  Toileting- Clothing Manipulation and Hygiene: Min guard;Sit to/from Nurse, children's Details (indicate cue type and reason): Pt reports he will use shower seat at discharge  Functional mobility during ADLs: Min guard;Rolling walker       Vision Baseline Vision/History: Wears glasses;Cataracts Wears Glasses: At all times Patient Visual Report: No change from baseline Vision Assessment?: Yes Eye Alignment: Within Functional Limits Ocular Range of Motion: Within Functional Limits Alignment/Gaze Preference: Within Defined Limits Tracking/Visual Pursuits: Able to track stimulus in all quads without difficulty Visual Fields: No apparent deficits Additional Comments: able to read clock without difficulty      Perception     Praxis      Pertinent Vitals/Pain Pain Assessment: No/denies pain     Hand Dominance Left   Extremity/Trunk Assessment Upper Extremity Assessment Upper Extremity Assessment: Overall WFL for tasks assessed   Lower Extremity Assessment Lower Extremity Assessment: Overall WFL for tasks assessed   Cervical / Trunk Assessment Cervical / Trunk Assessment: Kyphotic   Communication Communication Communication: HOH   Cognition Arousal/Alertness: Awake/alert Behavior During Therapy: WFL for tasks assessed/performed Overall Cognitive Status: No family/caregiver present to determine baseline cognitive functioning  General Comments: WFL for basic tasks    General Comments  noted ecchymosis R lateral trunk, denies pain    Exercises     Shoulder Instructions      Home Living Family/patient expects to be discharged to:: Private residence Living Arrangements:  Children Available Help at Discharge: Family Type of Home: House Home Access: Stairs to enter [employer] of Steps: couple   Home Layout: One level     Bathroom Shower/Tub: Tub/shower unit         Home Equipment: Environmental consultant - 2 wheels;Shower seat   Additional Comments: Pt reports daughter is with him all the time       Prior Functioning/Environment Level of Independence: Independent        Comments: Pt reports he drives and mows grass using a riding mower         OT Problem List: Impaired balance (sitting and/or standing)      OT Treatment/Interventions: Self-care/ADL training;DME and/or AE instruction;Therapeutic activities;Balance training;Patient/family education    OT Goals(Current goals can be found in the care plan section) Acute Rehab OT Goals Patient Stated Goal: to go home today  OT Goal Formulation: All assessment and education complete, DC therapy  OT Frequency: Min 2X/week   Barriers to D/C:            Co-evaluation              AM-PAC OT "6 Clicks" Daily Activity     Outcome Measure Help from another person eating meals?: None Help from another person taking care of personal grooming?: A Little Help from another person toileting, which includes using toliet, bedpan, or urinal?: A Little Help from another person bathing (including washing, rinsing, drying)?: A Little Help from another person to put on and taking off regular upper body clothing?: A Little Help from another person to put on and taking off regular lower body clothing?: A Little 6 Click Score: 19   End of Session Nurse Communication: Mobility status  Activity Tolerance: Patient tolerated treatment well Patient left: in bed;with call bell/phone within reach  OT Visit Diagnosis: Unsteadiness on feet (R26.81)                Time: 6063-0160 OT Time Calculation (min): 17 min Charges:  OT General Charges $OT Visit: 1 Visit OT Evaluation $OT Eval Low Complexity: 1  Low  Nilsa Nutting., OTR/L Acute Rehabilitation Services Pager [phone] Office [phone]   Lucille Passy M 11/13/2019, 12:46 PM

## 2019-11-13 NOTE — Progress Notes (Signed)
Patient's friend Gershon Mussel arrived to pick up patient up and transport him home via private vehicle.  IV was removed and AVS discharge packet was given to [facility] with instructions to pick up medications at [facility] and notified of follow up appointment information.  No further questions at this time from the patient or caregivers.

## 2019-11-16 ENCOUNTER — Telehealth (HOSPITAL_COMMUNITY): Payer: Self-pay

## 2019-11-16 DIAGNOSIS — I679 Cerebrovascular disease, unspecified: Secondary | ICD-10-CM | POA: Diagnosis not present

## 2019-11-16 DIAGNOSIS — D6869 Other thrombophilia: Secondary | ICD-10-CM | POA: Diagnosis not present

## 2019-11-16 DIAGNOSIS — Z7901 Long term (current) use of anticoagulants: Secondary | ICD-10-CM | POA: Diagnosis not present

## 2019-11-16 DIAGNOSIS — E785 Hyperlipidemia, unspecified: Secondary | ICD-10-CM | POA: Diagnosis not present

## 2019-11-16 DIAGNOSIS — I6521 Occlusion and stenosis of right carotid artery: Secondary | ICD-10-CM | POA: Diagnosis not present

## 2019-11-16 DIAGNOSIS — I10 Essential (primary) hypertension: Secondary | ICD-10-CM | POA: Diagnosis not present

## 2019-11-16 DIAGNOSIS — I480 Paroxysmal atrial fibrillation: Secondary | ICD-10-CM | POA: Diagnosis not present

## 2019-11-16 DIAGNOSIS — J449 Chronic obstructive pulmonary disease, unspecified: Secondary | ICD-10-CM | POA: Diagnosis not present

## 2019-11-16 DIAGNOSIS — Z95828 Presence of other vascular implants and grafts: Secondary | ICD-10-CM | POA: Diagnosis not present

## 2019-11-16 DIAGNOSIS — I69352 Hemiplegia and hemiparesis following cerebral infarction affecting left dominant side: Secondary | ICD-10-CM | POA: Diagnosis not present

## 2019-11-16 NOTE — Telephone Encounter (Signed)
Called to schedule 2 wk f/u, no answer, no vm. AW  

## 2019-11-18 DIAGNOSIS — I69352 Hemiplegia and hemiparesis following cerebral infarction affecting left dominant side: Secondary | ICD-10-CM | POA: Diagnosis not present

## 2019-12-08 ENCOUNTER — Telehealth (HOSPITAL_COMMUNITY): Payer: Self-pay

## 2019-12-08 NOTE — Telephone Encounter (Signed)
Called to schedule 4 week f/u, no answer, no vm. AW

## 2019-12-18 DIAGNOSIS — R42 Dizziness and giddiness: Secondary | ICD-10-CM | POA: Diagnosis not present

## 2019-12-18 DIAGNOSIS — R197 Diarrhea, unspecified: Secondary | ICD-10-CM | POA: Diagnosis not present

## 2019-12-18 DIAGNOSIS — R531 Weakness: Secondary | ICD-10-CM | POA: Diagnosis not present

## 2019-12-18 DIAGNOSIS — I10 Essential (primary) hypertension: Secondary | ICD-10-CM | POA: Diagnosis not present

## 2019-12-18 DIAGNOSIS — R0902 Hypoxemia: Secondary | ICD-10-CM | POA: Diagnosis not present

## 2019-12-18 DIAGNOSIS — J449 Chronic obstructive pulmonary disease, unspecified: Secondary | ICD-10-CM | POA: Diagnosis not present

## 2019-12-18 DIAGNOSIS — M1990 Unspecified osteoarthritis, unspecified site: Secondary | ICD-10-CM | POA: Diagnosis not present

## 2019-12-18 DIAGNOSIS — Z79899 Other long term (current) drug therapy: Secondary | ICD-10-CM | POA: Diagnosis not present

## 2019-12-18 DIAGNOSIS — I959 Hypotension, unspecified: Secondary | ICD-10-CM | POA: Diagnosis not present

## 2019-12-18 DIAGNOSIS — Z7952 Long term (current) use of systemic steroids: Secondary | ICD-10-CM | POA: Diagnosis not present

## 2019-12-18 DIAGNOSIS — Z8673 Personal history of transient ischemic attack (TIA), and cerebral infarction without residual deficits: Secondary | ICD-10-CM | POA: Diagnosis not present

## 2019-12-18 DIAGNOSIS — R112 Nausea with vomiting, unspecified: Secondary | ICD-10-CM | POA: Diagnosis not present

## 2019-12-22 ENCOUNTER — Telehealth (HOSPITAL_COMMUNITY): Payer: Self-pay

## 2019-12-22 NOTE — Telephone Encounter (Signed)
Third attempt to contact patient to schedule f/u, no answer, no vm. AW

## 2020-02-02 ENCOUNTER — Other Ambulatory Visit: Payer: Self-pay

## 2020-02-02 NOTE — Patient Outreach (Signed)
[place] [facility] [facility]) Care Management  02/02/2020  Ryan Hodge [birth year] [id number]   First telephone outreach attempt to obtain mRS. No answer. Called all 3 numbers listed in the chart. No voicemail set up on all of the numbers listed. Could not find any other contact information.  [facility] Management Assistant [phone]

## 2020-02-12 ENCOUNTER — Other Ambulatory Visit: Payer: Self-pay

## 2020-02-12 NOTE — Patient Outreach (Signed)
[place]) Care Management  02/12/2020  Ryan Hodge Apr 12, 1936 [id number]  Second telephone outreach attempt to obtain mRS. No answer. Unable to leave message on the main number for the patient, voicemail not set up. Attempted to contact 2 other contacts on file and both numbers are not correct. Could not find any other contact information.  [facility] Management Assistant [phone]

## 2020-02-22 ENCOUNTER — Other Ambulatory Visit: Payer: Self-pay

## 2020-02-22 NOTE — Patient Outreach (Signed)
[facility] [place] [facility]) Care Management  02/22/2020  Ryan Hodge 1936-01-13 [id number]   3 outreach attempts were completed to obtain mRs. mRs could not be obtained because patient never returned my calls. mRs=7    [facility] Management Assistant [phone]

## 2020-03-22 DIAGNOSIS — L0201 Cutaneous abscess of face: Secondary | ICD-10-CM | POA: Diagnosis not present

## 2020-04-19 DIAGNOSIS — K9281 Gastrointestinal mucositis (ulcerative): Secondary | ICD-10-CM | POA: Diagnosis not present

## 2020-05-26 DIAGNOSIS — E785 Hyperlipidemia, unspecified: Secondary | ICD-10-CM | POA: Diagnosis not present

## 2020-05-26 DIAGNOSIS — I69352 Hemiplegia and hemiparesis following cerebral infarction affecting left dominant side: Secondary | ICD-10-CM | POA: Diagnosis not present

## 2020-05-26 DIAGNOSIS — Z0000 Encounter for general adult medical examination without abnormal findings: Secondary | ICD-10-CM | POA: Diagnosis not present

## 2020-05-26 DIAGNOSIS — I672 Cerebral atherosclerosis: Secondary | ICD-10-CM | POA: Diagnosis not present

## 2020-05-26 DIAGNOSIS — Z79899 Other long term (current) drug therapy: Secondary | ICD-10-CM | POA: Diagnosis not present

## 2020-05-26 DIAGNOSIS — I480 Paroxysmal atrial fibrillation: Secondary | ICD-10-CM | POA: Diagnosis not present

## 2020-05-26 DIAGNOSIS — D6869 Other thrombophilia: Secondary | ICD-10-CM | POA: Diagnosis not present

## 2020-05-26 DIAGNOSIS — Z5329 Procedure and treatment not carried out because of patient's decision for other reasons: Secondary | ICD-10-CM | POA: Diagnosis not present

## 2020-05-26 DIAGNOSIS — G720 Drug-induced myopathy: Secondary | ICD-10-CM | POA: Diagnosis not present

## 2020-05-26 DIAGNOSIS — T466X5A Adverse effect of antihyperlipidemic and antiarteriosclerotic drugs, initial encounter: Secondary | ICD-10-CM | POA: Diagnosis not present

## 2020-06-13 DIAGNOSIS — J9611 Chronic respiratory failure with hypoxia: Secondary | ICD-10-CM | POA: Diagnosis not present

## 2020-06-13 DIAGNOSIS — E785 Hyperlipidemia, unspecified: Secondary | ICD-10-CM | POA: Diagnosis not present

## 2020-06-13 DIAGNOSIS — I10 Essential (primary) hypertension: Secondary | ICD-10-CM | POA: Diagnosis not present

## 2020-10-26 IMAGING — MR MR HEAD W/O CM
13 of 15 series · 35 of 48 positions shown · non-contrast
Comparison: Head CT from yesterday.  CT and CTA from yesterday

CLINICAL DATA: Stroke follow-up.  Left-sided weakness

EXAM:
MRI HEAD WITHOUT CONTRAST
MRA HEAD WITHOUT CONTRAST
TECHNIQUE: Multiplanar, multiecho pulse sequences of the brain and surrounding
structures were obtained without intravenous contrast. Angiographic
images of the head were obtained using MRA technique without
contrast.

[Series 5: DWI · axial · 3.0mm · 0.88mm/px · z∈[-60,+81]mm · 7 of 96 slices shown (1 of 4)]
[im 1/96]
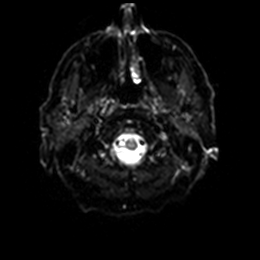
[im 16/96]
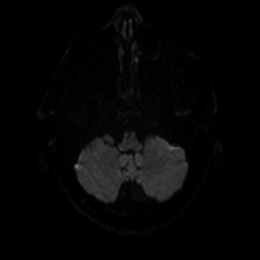
[im 32/96]
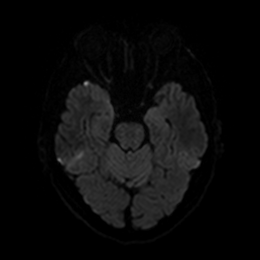
[im 48/96]
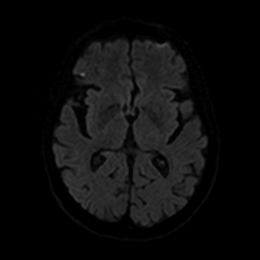
[im 64/96]
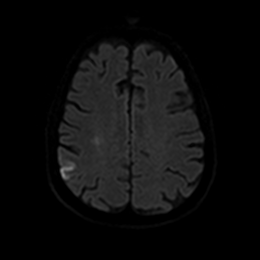
[im 80/96]
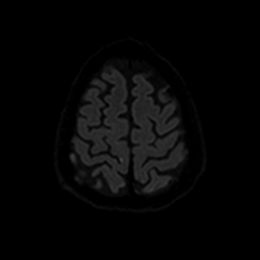
[im 96/96]
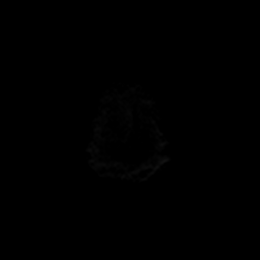

[Series 6: DWI · axial · 3.0mm · 0.88mm/px · z∈[-60,+81]mm · 4 of 48 slices shown (2 of 4)]
[im 1/48]
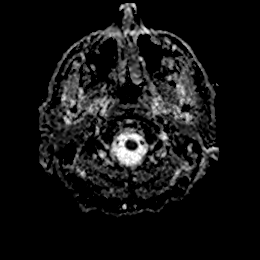
[im 16/48]
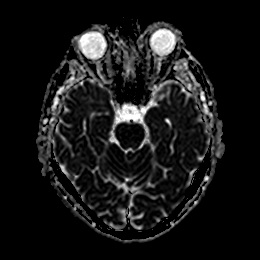
[im 32/48]
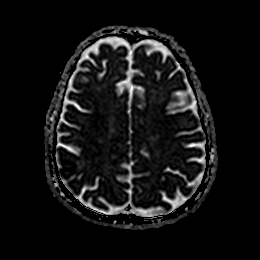
[im 48/48]
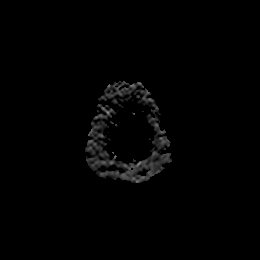

[Series 7: DWI · coronal · 4.0mm · 0.88mm/px · 4 of 64 slices shown (3 of 4)]
[im 1/64]
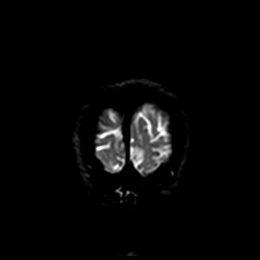
[im 22/64]
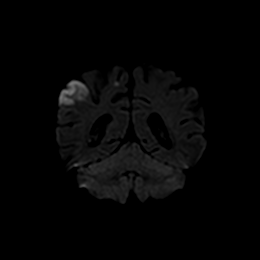
[im 43/64]
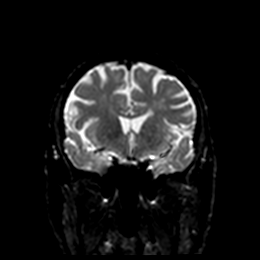
[im 64/64]
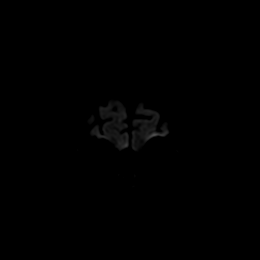

[Series 8: DWI · coronal · 4.0mm · 0.88mm/px · 2 of 32 slices shown (4 of 4)]
[im 1/32]
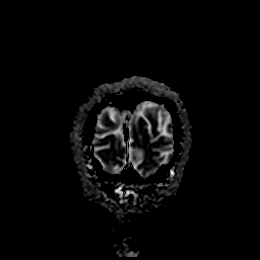
[im 32/32]
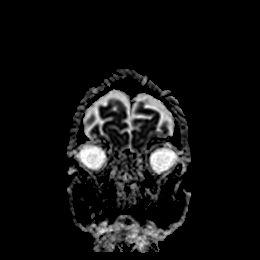

[Series 13: T1 · sagittal · 5.0mm · 0.75mm/px · 1 of 23 slices shown]
[im 1/23]
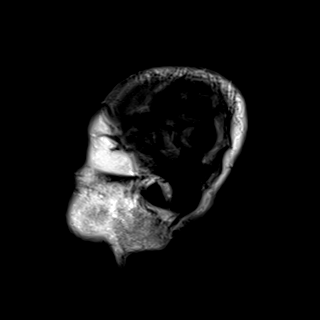

[Series 14: T2 · axial · 5.0mm · 0.72mm/px · 1 of 25 slices shown (1 of 2)]
[im 1/25]
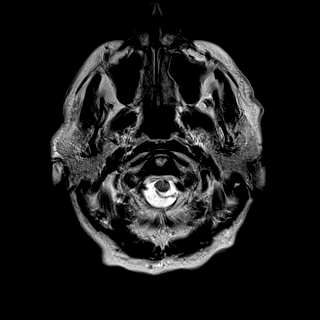

[Series 15: FLAIR · axial · 5.0mm · 0.45mm/px · 1 of 25 slices shown (1 of 2)]
[im 1/25]
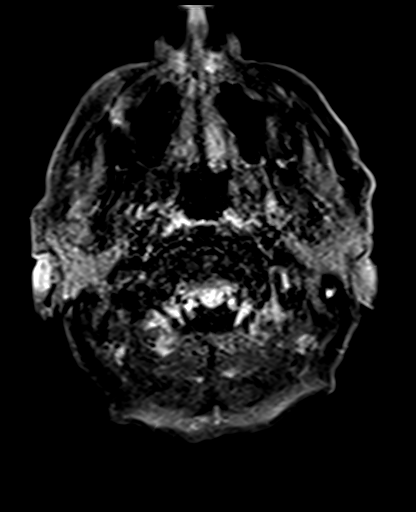

[Series 16: mag_images · axial · 3.0mm · 0.90mm/px · z∈[-76,+100]mm · 3 of 60 slices shown]
[im 1/60]
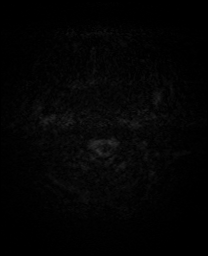
[im 30/60]
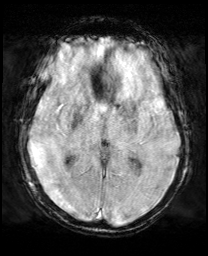
[im 60/60]
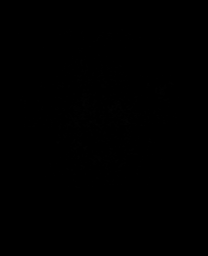

[Series 17: pha_images · axial · 3.0mm · 0.90mm/px · z∈[-70,+91]mm · 3 of 51 slices shown]
[im 1/51]
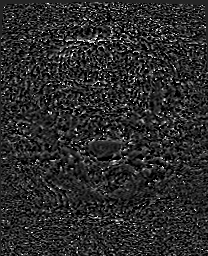
[im 26/51]
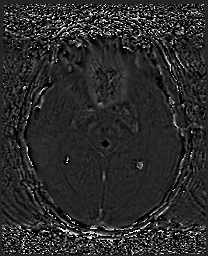
[im 51/51]
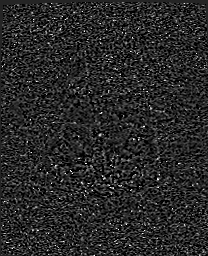

[Series 18: swi_images · axial · 3.0mm · 0.90mm/px · z∈[-76,+100]mm · 3 of 60 slices shown]
[im 1/60]
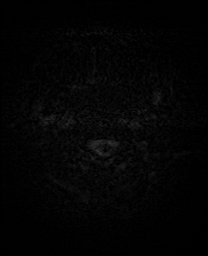
[im 30/60]
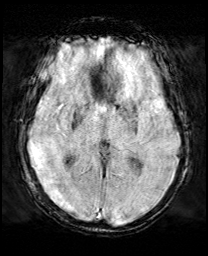
[im 60/60]
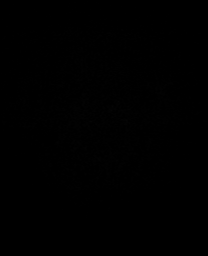

[Series 19: mip_images(sw) · axial · 24.0mm · 0.90mm/px · z∈[-66,+90]mm · 3 of 53 slices shown]
[im 1/53]
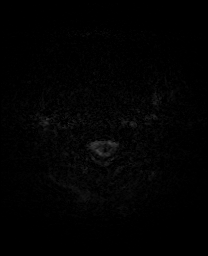
[im 27/53]
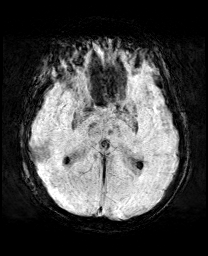
[im 53/53]
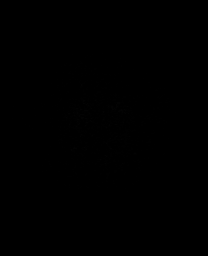

[Series 21: T2 · coronal · 5.0mm · 0.34mm/px · 2 of 29 slices shown (2 of 2)]
[im 1/29]
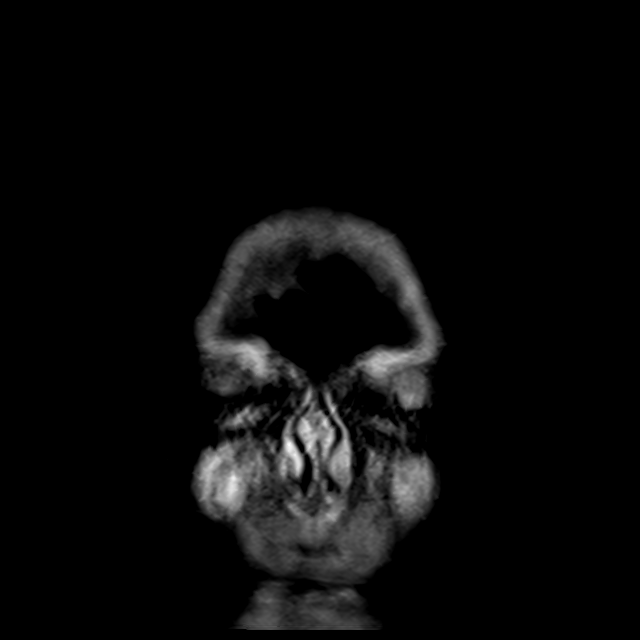
[im 29/29]
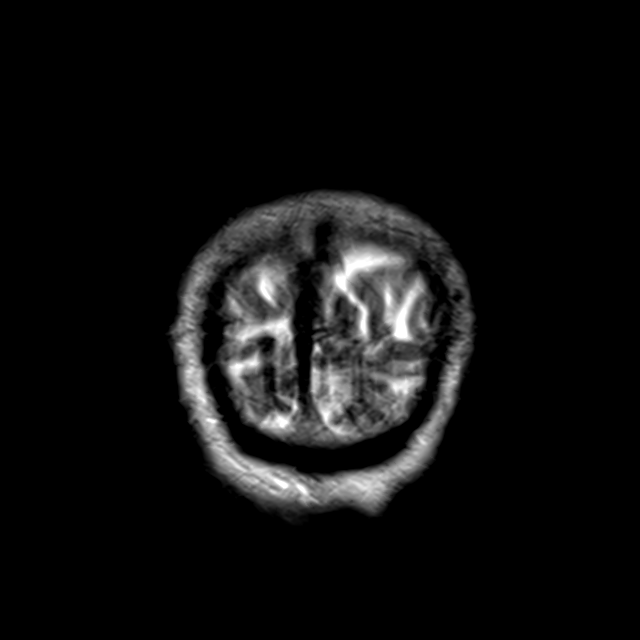

[Series 22: FLAIR · axial · 5.0mm · 0.90mm/px · 1 of 25 slices shown (2 of 2)]
[im 1/25]
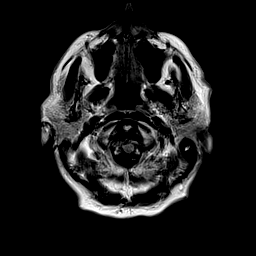

[35 of 48 positions shown; findings below may reference images not displayed]

FINDINGS: MRI HEAD FINDINGS

Brain: Overall small volume acute cortical infarction along the
right frontal parietal convexity and to a lesser extent at the right
frontal operculum. There is good correlation with preceding CT
perfusion. No acute hemorrhage.

Perforator infarct at the right corona radiata that is isointense on
ADC map.

No mass, hydrocephalus, or extra-axial collection. Age normal
cerebral volume. Mild chronic small vessel ischemia in the
periventricular white matter.

Vascular: See below

Skull and upper cervical spine: Negative for marrow lesion

Sinuses/Orbits: No acute finding.  Right cataract resection

MRA HEAD FINDINGS

Limited by moderate motion artifact. Robust flow in the bilateral
carotid and vertebral arteries.

Moderate narrowing at the right vertebrobasilar junction.

There is atheromatous change to bilateral MCA vessels which is
overestimated on this motion degraded study. Reference prior CTA.

No detected aneurysm or vascular malformation.
IMPRESSION: Brain MRI:

1. Patchy acute infarcts in the right MCA territory, most confluent
at the frontal parietal junction. There is good correlation with
previous CT perfusion.
2. Subacute infarct at the right corona radiata.

Intracranial MRA:

1. Symmetric flow in the stented right ICA.
2. Intracranial atheromatous stenoses are better assessed on the
preceding CTA given the degree of motion artifact. There is a
redemonstrated moderate narrowing of the right vertebrobasilar
junction

## 2021-03-15 DIAGNOSIS — D485 Neoplasm of uncertain behavior of skin: Secondary | ICD-10-CM | POA: Diagnosis not present

## 2021-05-09 DIAGNOSIS — I700 Atherosclerosis of aorta: Secondary | ICD-10-CM | POA: Diagnosis not present

## 2021-05-09 DIAGNOSIS — N1831 Chronic kidney disease, stage 3a: Secondary | ICD-10-CM | POA: Diagnosis not present

## 2021-05-09 DIAGNOSIS — E785 Hyperlipidemia, unspecified: Secondary | ICD-10-CM | POA: Diagnosis not present

## 2021-05-09 DIAGNOSIS — I10 Essential (primary) hypertension: Secondary | ICD-10-CM | POA: Diagnosis not present

## 2021-05-09 DIAGNOSIS — G720 Drug-induced myopathy: Secondary | ICD-10-CM | POA: Diagnosis not present

## 2021-05-09 DIAGNOSIS — J418 Mixed simple and mucopurulent chronic bronchitis: Secondary | ICD-10-CM | POA: Diagnosis not present

## 2021-05-09 DIAGNOSIS — T466X5A Adverse effect of antihyperlipidemic and antiarteriosclerotic drugs, initial encounter: Secondary | ICD-10-CM | POA: Diagnosis not present

## 2021-05-09 DIAGNOSIS — I7143 Infrarenal abdominal aortic aneurysm, without rupture: Secondary | ICD-10-CM | POA: Diagnosis not present

## 2021-05-09 DIAGNOSIS — I69352 Hemiplegia and hemiparesis following cerebral infarction affecting left dominant side: Secondary | ICD-10-CM | POA: Diagnosis not present

## 2021-05-09 DIAGNOSIS — K224 Dyskinesia of esophagus: Secondary | ICD-10-CM | POA: Diagnosis not present

## 2021-08-16 DIAGNOSIS — G720 Drug-induced myopathy: Secondary | ICD-10-CM | POA: Diagnosis not present

## 2021-08-16 DIAGNOSIS — D6869 Other thrombophilia: Secondary | ICD-10-CM | POA: Diagnosis not present

## 2021-08-16 DIAGNOSIS — R202 Paresthesia of skin: Secondary | ICD-10-CM | POA: Diagnosis not present

## 2021-08-16 DIAGNOSIS — R7302 Impaired glucose tolerance (oral): Secondary | ICD-10-CM | POA: Diagnosis not present

## 2021-08-16 DIAGNOSIS — I480 Paroxysmal atrial fibrillation: Secondary | ICD-10-CM | POA: Diagnosis not present

## 2021-08-16 DIAGNOSIS — T466X5A Adverse effect of antihyperlipidemic and antiarteriosclerotic drugs, initial encounter: Secondary | ICD-10-CM | POA: Diagnosis not present

## 2021-08-16 DIAGNOSIS — K224 Dyskinesia of esophagus: Secondary | ICD-10-CM | POA: Diagnosis not present

## 2021-08-16 DIAGNOSIS — Z0000 Encounter for general adult medical examination without abnormal findings: Secondary | ICD-10-CM | POA: Diagnosis not present

## 2021-08-16 DIAGNOSIS — E785 Hyperlipidemia, unspecified: Secondary | ICD-10-CM | POA: Diagnosis not present

## 2021-08-16 DIAGNOSIS — Z79899 Other long term (current) drug therapy: Secondary | ICD-10-CM | POA: Diagnosis not present

## 2021-08-16 DIAGNOSIS — I69352 Hemiplegia and hemiparesis following cerebral infarction affecting left dominant side: Secondary | ICD-10-CM | POA: Diagnosis not present

## 2021-08-16 DIAGNOSIS — N1831 Chronic kidney disease, stage 3a: Secondary | ICD-10-CM | POA: Diagnosis not present

## 2021-09-20 DIAGNOSIS — D6489 Other specified anemias: Secondary | ICD-10-CM | POA: Diagnosis not present

## 2021-09-20 DIAGNOSIS — N1831 Chronic kidney disease, stage 3a: Secondary | ICD-10-CM | POA: Diagnosis not present

## 2021-11-06 DIAGNOSIS — D6489 Other specified anemias: Secondary | ICD-10-CM | POA: Diagnosis not present

## 2021-11-06 DIAGNOSIS — R790 Abnormal level of blood mineral: Secondary | ICD-10-CM | POA: Diagnosis not present

## 2021-11-06 DIAGNOSIS — R636 Underweight: Secondary | ICD-10-CM | POA: Diagnosis not present

## 2021-11-06 DIAGNOSIS — E440 Moderate protein-calorie malnutrition: Secondary | ICD-10-CM | POA: Diagnosis not present

## 2021-11-06 DIAGNOSIS — Z681 Body mass index (BMI) 19 or less, adult: Secondary | ICD-10-CM | POA: Diagnosis not present

## 2021-11-06 DIAGNOSIS — D649 Anemia, unspecified: Secondary | ICD-10-CM | POA: Diagnosis not present

## 2021-11-06 DIAGNOSIS — N1831 Chronic kidney disease, stage 3a: Secondary | ICD-10-CM | POA: Diagnosis not present

## 2021-11-06 DIAGNOSIS — D500 Iron deficiency anemia secondary to blood loss (chronic): Secondary | ICD-10-CM | POA: Diagnosis not present

## 2021-11-06 DIAGNOSIS — I7143 Infrarenal abdominal aortic aneurysm, without rupture: Secondary | ICD-10-CM | POA: Diagnosis not present

## 2021-11-15 DIAGNOSIS — I7143 Infrarenal abdominal aortic aneurysm, without rupture: Secondary | ICD-10-CM | POA: Diagnosis not present

## 2021-11-15 DIAGNOSIS — D6489 Other specified anemias: Secondary | ICD-10-CM | POA: Diagnosis not present

## 2021-11-15 DIAGNOSIS — I708 Atherosclerosis of other arteries: Secondary | ICD-10-CM | POA: Diagnosis not present

## 2021-12-04 DIAGNOSIS — K5900 Constipation, unspecified: Secondary | ICD-10-CM | POA: Diagnosis not present

## 2021-12-04 DIAGNOSIS — D509 Iron deficiency anemia, unspecified: Secondary | ICD-10-CM | POA: Diagnosis not present

## 2021-12-13 DIAGNOSIS — Z01818 Encounter for other preprocedural examination: Secondary | ICD-10-CM | POA: Diagnosis not present

## 2021-12-13 DIAGNOSIS — H353131 Nonexudative age-related macular degeneration, bilateral, early dry stage: Secondary | ICD-10-CM | POA: Diagnosis not present

## 2021-12-13 DIAGNOSIS — H25812 Combined forms of age-related cataract, left eye: Secondary | ICD-10-CM | POA: Diagnosis not present

## 2021-12-26 DIAGNOSIS — H259 Unspecified age-related cataract: Secondary | ICD-10-CM | POA: Diagnosis not present

## 2021-12-26 DIAGNOSIS — Z8673 Personal history of transient ischemic attack (TIA), and cerebral infarction without residual deficits: Secondary | ICD-10-CM | POA: Diagnosis not present

## 2021-12-26 DIAGNOSIS — Z79899 Other long term (current) drug therapy: Secondary | ICD-10-CM | POA: Diagnosis not present

## 2021-12-26 DIAGNOSIS — I10 Essential (primary) hypertension: Secondary | ICD-10-CM | POA: Diagnosis not present

## 2021-12-26 DIAGNOSIS — H25812 Combined forms of age-related cataract, left eye: Secondary | ICD-10-CM | POA: Diagnosis not present

## 2021-12-26 DIAGNOSIS — H26491 Other secondary cataract, right eye: Secondary | ICD-10-CM | POA: Diagnosis not present

## 2021-12-26 DIAGNOSIS — J449 Chronic obstructive pulmonary disease, unspecified: Secondary | ICD-10-CM | POA: Diagnosis not present

## 2021-12-28 DIAGNOSIS — D509 Iron deficiency anemia, unspecified: Secondary | ICD-10-CM | POA: Diagnosis not present

## 2022-01-04 DIAGNOSIS — K5900 Constipation, unspecified: Secondary | ICD-10-CM | POA: Diagnosis not present

## 2022-01-04 DIAGNOSIS — D509 Iron deficiency anemia, unspecified: Secondary | ICD-10-CM | POA: Diagnosis not present

## 2022-01-23 DIAGNOSIS — N481 Balanitis: Secondary | ICD-10-CM | POA: Diagnosis not present

## 2022-01-23 DIAGNOSIS — D518 Other vitamin B12 deficiency anemias: Secondary | ICD-10-CM | POA: Diagnosis not present

## 2022-01-23 DIAGNOSIS — I10 Essential (primary) hypertension: Secondary | ICD-10-CM | POA: Diagnosis not present

## 2022-01-23 DIAGNOSIS — E538 Deficiency of other specified B group vitamins: Secondary | ICD-10-CM | POA: Diagnosis not present

## 2022-01-23 DIAGNOSIS — Z2821 Immunization not carried out because of patient refusal: Secondary | ICD-10-CM | POA: Diagnosis not present

## 2022-05-21 DIAGNOSIS — J449 Chronic obstructive pulmonary disease, unspecified: Secondary | ICD-10-CM | POA: Insufficient documentation

## 2022-05-21 NOTE — Progress Notes (Addendum)
Cardiology Office Note:    Date:  05/22/2022   ID:  Ryan Hodge, DOB [birth year], MRN [id number]  PCP:  Street, Sharon Mt, MD  Cardiologist:  Shirlee More, MD   Referring MD: [address] Sharon Mt, *  ASSESSMENT:    1. SOB (shortness of breath)   2. Essential hypertension   3. Cerebral infarction R MCA due to occlusion of right carotid artery (HCC) s/p tPA and partial revascularization w/ R ICA stent placement   4. Chronic obstructive pulmonary disease, unspecified COPD type ([place])    PLAN:    In order of problems listed above:  I explained to Ryan Hodge that in general just 2 etiologies of his SOB ,  heart failure or  pulmonary and cardiac his presentation does not show signs and symptoms of heart failure or valvular heart disease I suspect his problem is primarily pulmonary especially with exertional hypoxemia coming in the office abnormal chest x-ray in 2021 and symptoms quite suggestive of chronic lung disease.  He is looking for answers unguinal go ahead and do a CT of the chest without contrast and should help to define the severity of his lung disease echocardiogram proBNP level and repeat the abnormal labs hemoglobin and CKD renal dysfunction.  He has such a hearing deficit I will think we can communicate by phone with him and will need to bring him back to the office for follow-up.  The other question is whether he has significant hypertension we will recheck his blood pressure before leaving our office today and he may require optimization of therapy. He has done well after stroke In the end I think his problem is COPD and he may well require ambulatory oxygen  Next appointment   Medication Adjustments/Labs and Tests Ordered: Current medicines are reviewed at length with the patient today.  Concerns regarding medicines are outlined above.  Orders Placed This Encounter  Procedures   EKG 12-Lead   No orders of the defined types were placed in this encounter.     Chief Complaint  Patient presents with   Shortness of Breath    Self referred for SOB  6 weeks  History of Present Illness:    Ryan Hodge is a 87 y.o. male with a history of hypertension COPD and stroke who is being seen today for the evaluation of shortness of breath at the request of Street, Sharon Mt, *.  Seen with Leighton Ruff, CMA chaperone  He has a history of stroke right middle cerebral artery occlusion with interventional radiology revascularization and stent to the right internal carotid artery July 2021.  Other problems include hypertension hyperlipidemia and blood loss anemia.  He has a history of COVID-19 infection and respiratory failure with hospitalization October 2020.  Echocardiogram performed 11/12/2019 showed low normal ejection fraction normal diastolic function right ventricle normal size and function no finding of pulmonary artery hypertension and no significant valvular abnormality.  He is referred himself for shortness of breath that is worsening.  When he bathes he needs to sit in a chair and rest in the shower he is short of breath for anything other than ADLs and has trouble going outside to the car has to stop and rest to recover He used oxygen when he was hospitalized years ago with pneumonia but not as an outpatient. 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 We quickly obtained a chest x-ray that was  done in 2021 which showed bilateral pleural and parenchymal lung scan scarring and emphysema He does not have edema orthopnea chest pain palpitation or syncope He has no known history of heart disease congenital rheumatic or atrial fibrillation He coughs and brings up phlegm every day consistent with chronic bronchitis but is not purulent he does not wheeze He has not had a pulmonary evaluation His oxygen saturation is diminished in the office today initially  88% walking  and subsequently 93%. Past Medical History:  Diagnosis Date   COPD (chronic obstructive pulmonary disease) ([place])    Hypertension     Past Surgical History:  Procedure Laterality Date   ESOPHAGOGASTRODUODENOSCOPY N/A 07/29/2016   Procedure: ESOPHAGOGASTRODUODENOSCOPY (EGD);  Surgeon: Juanita Craver, MD;  Location[facility] ENDOSCOPY;  Service: Endoscopy;  Laterality: N/A;   IR INTRAVSC STENT CERV CAROTID W/O EMB-PROT MOD SED INC ANGIO  11/11/2019   IR PERCUTANEOUS ART THROMBECTOMY/INFUSION INTRACRANIAL INC DIAG ANGIO  11/11/2019   RADIOLOGY WITH ANESTHESIA N/A 11/11/2019   Procedure: IR WITH ANESTHESIA - CODE STROKE;  Surgeon: Luanne Bras, MD;  Location: [facility];  Service: Radiology;  Laterality: N/A;    Current Medications: Current Meds  Medication Sig   aspirin EC 81 MG tablet Take 81 mg by mouth daily. Swallow whole.   verapamil (CALAN-SR) 120 MG CR tablet Take 120 mg by mouth daily.     Allergies:   Patient has no known allergies.   Social History   Socioeconomic History   Marital status: Married    Spouse name: Not on file   Number of children: Not on file   Years of education: Not on file   Highest education level: Not on file  Occupational History   Not on file  Tobacco Use   Smoking status: Never   Smokeless tobacco: Never  Substance and Sexual Activity   Alcohol use: No   Drug use: No   Sexual activity: Not on file  Other Topics Concern   Not on file  Social History Narrative   Not on file   Social Determinants of Health   Financial Resource Strain: Not on file  Food Insecurity: Not on file  Transportation Needs: Not on file  Physical Activity: Not on file  Stress: Not on file  Social Connections: Not on file     Family History: The patient's family history is not on file.  ROS:   ROS Please see the history of present illness.     All other systems reviewed and are negative.  EKGs/Labs/Other Studies Reviewed:    The following studies were reviewed  today:   EKG:  EKG is  ordered today.  The ekg ordered today is personally reviewed and demonstrates sinus rhythm voltage criteria for LVH nonspecific ST abnormality  Recent Labs: 12/28/2021 hemoglobin 10.6 11/06/2021 creatinine 1.4 potassium 4.8053 2023 cholesterol 180 triglycerides 127 Recent Lipid Panel    Component Value Date/Time   CHOL 193 11/12/2019 0445   TRIG 71 11/12/2019 0445   HDL 51 11/12/2019 0445   CHOLHDL 3.8 11/12/2019 0445   VLDL 14 11/12/2019 0445   LDLCALC 128 (H) 11/12/2019 0445    Physical Exam:    VS:  BP (!) 190/88 (BP Location: Right Arm)   Pulse 82   Ht 6' 2"$  (1.88 m)   Wt 143 lb (64.9 kg)   SpO2 93%   BMI 18.36 kg/m     Wt Readings from Last 3 Encounters:  05/22/22 143 lb (64.9 kg)  11/11/19 153 lb 10.6  oz (69.7 kg)  07/29/16 165 lb (74.8 kg)     GEN: Tall thin appearance appears emphysematous hyperinflated barrel chested looks his age well nourished, well developed in no acute distress HEENT: Normal NECK: No JVD; No carotid bruits LYMPHATICS: No lymphadenopathy CARDIAC: Distant heart sounds RRR, no murmurs, rubs, gallops RESPIRATORY: Diminished breath sounds prolonged expiration no wheezing or rhonchi ABDOMEN: Soft, non-tender, non-distended MUSCULOSKELETAL:  No edema; No deformity  SKIN: Warm and dry NEUROLOGIC:  Alert and oriented x 3 PSYCHIATRIC:  Normal affect     Signed, Shirlee More, MD  05/22/2022 8:08 AM    [place]

## 2022-05-22 ENCOUNTER — Encounter: Payer: Self-pay | Admitting: Cardiology

## 2022-05-22 ENCOUNTER — Ambulatory Visit: Payer: Medicare HMO | Attending: Cardiology | Admitting: Cardiology

## 2022-05-22 VITALS — BP 172/76 | HR 82 | Ht 74.0 in | Wt 143.0 lb

## 2022-05-22 DIAGNOSIS — R0602 Shortness of breath: Secondary | ICD-10-CM

## 2022-05-22 DIAGNOSIS — I10 Essential (primary) hypertension: Secondary | ICD-10-CM | POA: Diagnosis not present

## 2022-05-22 DIAGNOSIS — I63231 Cerebral infarction due to unspecified occlusion or stenosis of right carotid arteries: Secondary | ICD-10-CM

## 2022-05-22 DIAGNOSIS — J449 Chronic obstructive pulmonary disease, unspecified: Secondary | ICD-10-CM | POA: Diagnosis not present

## 2022-05-22 NOTE — Patient Instructions (Signed)
Medication Instructions:  Your physician recommends that you continue on your current medications as directed. Please refer to the Current Medication list given to you today.  *If you need a refill on your cardiac medications before your next appointment, please call your pharmacy*   Lab Work: Your physician recommends that you return for lab work in:   Labs today: BMP, CBC, Pro BNP  If you have labs (blood work) drawn today and your tests are completely normal, you will receive your results only by: MyChart Message (if you have MyChart) OR A paper copy in the mail If you have any lab test that is abnormal or we need to change your treatment, we will call you to review the results.   Testing/Procedures: Non-Cardiac CT scanning, (CAT scanning), is a noninvasive, special x-ray that produces cross-sectional images of the body using x-rays and a computer. CT scans help physicians diagnose and treat medical conditions. For some CT exams, a contrast material is used to enhance visibility in the area of the body being studied. CT scans provide greater clarity and reveal more details than regular x-ray exams.  Your physician has requested that you have an echocardiogram. Echocardiography is a painless test that uses sound waves to create images of your heart. It provides your doctor with information about the size and shape of your heart and how well your heart's chambers and valves are working. This procedure takes approximately one hour. There are no restrictions for this procedure. Please do NOT wear cologne, perfume, aftershave, or lotions (deodorant is allowed). Please arrive 15 minutes prior to your appointment time.     Follow-Up: At [facility], you and your health needs are our priority.  As part of our continuing mission to provide you with exceptional heart care, we have created designated Provider Care Teams.  These Care Teams include your primary Cardiologist (physician)  and Advanced Practice Providers (APPs -  Physician Assistants and Nurse Practitioners) who all work together to provide you with the care you need, when you need it.  We recommend signing up for the patient portal called "MyChart".  Sign up information is provided on this After Visit Summary.  MyChart is used to connect with patients for Virtual Visits (Telemedicine).  Patients are able to view lab/test results, encounter notes, upcoming appointments, etc.  Non-urgent messages can be sent to your provider as well.   To learn more about what you can do with MyChart, go to [email or web address].    Your next appointment:   6 week(s)  Provider:   Shirlee More, MD    Other Instructions None

## 2022-05-24 LAB — CBC
Hematocrit: 36.7 % — ABNORMAL LOW (ref 37.5–51.0)
Hemoglobin: 12 g/dL — ABNORMAL LOW (ref 13.0–17.7)
MCH: 32 pg (ref 26.6–33.0)
MCHC: 32.7 g/dL (ref 31.5–35.7)
MCV: 98 fL — ABNORMAL HIGH (ref 79–97)
Platelets: 285 10*3/uL (ref 150–450)
RBC: 3.75 x10E6/uL — ABNORMAL LOW (ref 4.14–5.80)
RDW: 14.1 % (ref 11.6–15.4)
WBC: 6.6 10*3/uL (ref 3.4–10.8)

## 2022-05-24 LAB — BASIC METABOLIC PANEL
BUN/Creatinine Ratio: 22 (ref 10–24)
BUN: 26 mg/dL (ref 8–27)
CO2: 26 mmol/L (ref 20–29)
Calcium: 9.3 mg/dL (ref 8.6–10.2)
Chloride: 103 mmol/L (ref 96–106)
Creatinine, Ser: 1.19 mg/dL (ref 0.76–1.27)
Glucose: 91 mg/dL (ref 70–99)
Potassium: 5 mmol/L (ref 3.5–5.2)
Sodium: 142 mmol/L (ref 134–144)
eGFR: 60 mL/min/{1.73_m2} (ref >59)

## 2022-05-24 LAB — PRO B NATRIURETIC PEPTIDE: NT-Pro BNP: 1874 pg/mL — ABNORMAL HIGH (ref 0–486)

## 2022-06-04 ENCOUNTER — Ambulatory Visit: Payer: Medicare HMO | Attending: Cardiology

## 2022-06-04 DIAGNOSIS — R0602 Shortness of breath: Secondary | ICD-10-CM | POA: Diagnosis not present

## 2022-06-04 DIAGNOSIS — I63231 Cerebral infarction due to unspecified occlusion or stenosis of right carotid arteries: Secondary | ICD-10-CM

## 2022-06-04 DIAGNOSIS — J449 Chronic obstructive pulmonary disease, unspecified: Secondary | ICD-10-CM

## 2022-06-04 DIAGNOSIS — I10 Essential (primary) hypertension: Secondary | ICD-10-CM

## 2022-06-04 LAB — ECHOCARDIOGRAM COMPLETE
Area-P 1/2: 3.97 cm2
Calc EF: 45.4 %
S' Lateral: 4.3 cm
Single Plane A2C EF: 45.4 %
Single Plane A4C EF: 44.4 %

## 2022-06-05 ENCOUNTER — Telehealth: Payer: Self-pay | Admitting: Cardiology

## 2022-06-05 NOTE — Progress Notes (Signed)
Exam observed by nicolle wallace

## 2022-06-05 NOTE — Telephone Encounter (Signed)
Pt daughter returning nurses call in regards to Echo results. Please advise.

## 2022-06-06 ENCOUNTER — Other Ambulatory Visit: Payer: Self-pay

## 2022-06-06 MED ORDER — FUROSEMIDE 20 MG PO TABS: 20.0000 mg | ORAL_TABLET | Freq: Every day | ORAL | 3 refills | Status: DC

## 2022-06-06 MED ORDER — ENTRESTO 24-26 MG PO TABS: 1.0000 | ORAL_TABLET | Freq: Two times a day (BID) | ORAL | 3 refills | Status: DC

## 2022-06-06 MED ORDER — DAPAGLIFLOZIN PROPANEDIOL 10 MG PO TABS: 10.0000 mg | ORAL_TABLET | Freq: Every day | ORAL | 3 refills | Status: DC

## 2022-06-06 NOTE — Telephone Encounter (Signed)
Patient informed of results.  

## 2022-07-07 NOTE — Progress Notes (Unsigned)
Cardiology Office Note:    Date:  07/09/2022   ID:  Robertson Modglin, DOB 27-May-1935, MRN [id number]  PCP:  Street, Sharon Mt, MD  Cardiologist:  Shirlee More, MD    Referring MD: [address] [place] Mt, *    ASSESSMENT:    1. SOB (shortness of breath)   2. Essential hypertension   3. Chronic obstructive pulmonary disease, unspecified COPD type ([place])   4. Cerebral infarction R MCA due to occlusion of right carotid artery (HCC) s/p tPA and partial revascularization w/ R ICA stent placement    PLAN:    In order of problems listed above:  His shortness of breath is obviously multifactorial, he was placed on loop diuretic after his last visit on presentation he is not hypoxic at this time he has no edema I am not sure he is functionally improved and I think he has very significant underlying lung disease I will have him go ahead and get that CT of his chest will initiate a bronchodilator and continue his low-dose Entresto and loop diuretic rechecking labs for safety BMP proBNP today Restart single antiplatelet agent aspirin Restart his statin   Next appointment: 3 months   Medication Adjustments/Labs and Tests Ordered: Current medicines are reviewed at length with the patient today.  Concerns regarding medicines are outlined above.  No orders of the defined types were placed in this encounter.  No orders of the defined types were placed in this encounter.   Chief complaint follow-up for heart failure he was placed on furosemide after his last visit   History of Present Illness:    Ryan Hodge is a 87 y.o. male with a hx of hypertension COPD and stroke last seen 05/22/2022 for shortness of breath.  Echocardiogram was performed showing mildly decreased ejection fraction 40 to 45% normal right ventricular size function and pulmonary artery pressure and no significant valvular abnormality.  With elevated proBNP level and abnormal echocardiogram he is placed on  diuretic for heart failure.  He has a history of stroke right middle cerebral artery occlusion with interventional radiology revascularization and stent to the right internal carotid artery July 2021.  Other problems include hypertension hyperlipidemia and blood loss anemia.   He has a history of COVID-19 infection and respiratory failure with hospitalization October 2020.   Echocardiogram performed 11/12/2019 showed low normal ejection fraction normal diastolic function right ventricle normal size and function no finding of pulmonary artery hypertension and no significant valvular abnormality.  For further evaluation CBC showed mild anemia hemoglobin 12.0 proBNP level was significantly elevated 1874 consistent with heart failure renal function was preserved creatinine 1.19 GFR 60 cc potassium 5.0  Compliance with diet, lifestyle and medications: Yes  He is somewhat improved he tells me he is not as short of breath but he just cannot do anything more than usual activities because of profound weakness and exertional shortness of breath He is not hypoxic on presentation today He never had the CT of his chest performed pulmonary ordered today Is very confusing about his medications it appears he is not taking aspirin he will start not taking a atorvastatin will restart it says he is taking verapamil and Brilinta we will discontinue this we will check labs today including a BMP and a proBNP He has obvious lung disease he has wheezing walking in the room without a stethoscope would not start using a bronchodilator Very concerned about his ability to take care of himself nobody accompanied him to the office today  and he has a profound hearing deficit He does not have edema orthopnea chest pain palpitation or syncope Past Medical History:  Diagnosis Date   COPD (chronic obstructive pulmonary disease) ([place])    Hypertension     Past Surgical History:  Procedure Laterality Date    ESOPHAGOGASTRODUODENOSCOPY N/A 07/29/2016   Procedure: ESOPHAGOGASTRODUODENOSCOPY (EGD);  Surgeon: Juanita Craver, MD;  Location: [facility] ENDOSCOPY;  Service: Endoscopy;  Laterality: N/A;   IR INTRAVSC STENT CERV CAROTID W/O EMB-PROT MOD SED INC ANGIO  11/11/2019   IR PERCUTANEOUS ART THROMBECTOMY/INFUSION INTRACRANIAL INC DIAG ANGIO  11/11/2019   RADIOLOGY WITH ANESTHESIA N/A 11/11/2019   Procedure: IR WITH ANESTHESIA - CODE STROKE;  Surgeon: Luanne Bras, MD;  Location: [facility];  Service: Radiology;  Laterality: N/A;    Current Medications: Current Meds  Medication Sig   dapagliflozin propanediol (FARXIGA) 10 MG TABS tablet Take 1 tablet (10 mg total) by mouth daily before breakfast.   furosemide (LASIX) 20 MG tablet Take 1 tablet (20 mg total) by mouth daily.   sacubitril-valsartan (ENTRESTO) 24-26 MG Take 1 tablet by mouth 2 (two) times daily.   [DISCONTINUED] dapagliflozin propanediol (FARXIGA) 10 MG TABS tablet Take 1 tablet (10 mg total) by mouth daily before breakfast.   [DISCONTINUED] furosemide (LASIX) 20 MG tablet Take 1 tablet (20 mg total) by mouth daily.   [DISCONTINUED] sacubitril-valsartan (ENTRESTO) 24-26 MG Take 1 tablet by mouth 2 (two) times daily.     Allergies:   Patient has no known allergies.   Social History   Socioeconomic History   Marital status: Married    Spouse name: Not on file   Number of children: Not on file   Years of education: Not on file   Highest education level: Not on file  Occupational History   Not on file  Tobacco Use   Smoking status: Never   Smokeless tobacco: Never  Substance and Sexual Activity   Alcohol use: No   Drug use: No   Sexual activity: Not on file  Other Topics Concern   Not on file  Social History Narrative   Not on file   Social Determinants of Health   Financial Resource Strain: Not on file  Food Insecurity: Not on file  Transportation Needs: Not on file  Physical Activity: Not on file  Stress: Not on file  Social  Connections: Not on file     Family History: The patient's family history is not on file. ROS:   Please see the history of present illness.    All other systems reviewed and are negative.  EKGs/Labs/Other Studies Reviewed:    The following studies were reviewed today:  Cardiac Studies & Procedures       ECHOCARDIOGRAM  ECHOCARDIOGRAM COMPLETE 06/04/2022  Narrative ECHOCARDIOGRAM REPORT    Patient Name:   RUSSEL RUMMLER Date of Exam: 06/04/2022 Medical Rec #:  [id number]             Height:       74.0 in Accession #:    [id number]            Weight:       143.0 lb Date of Birth:  December 01, 1935            BSA:          1.885 m Patient Age:    53 years              BP:  172/76 mmHg Patient Gender: M                     HR:           81 bpm. Exam Location:  [place]  Procedure: 2D Echo, Cardiac Doppler and Color Doppler  Indications:    SOB (shortness of breath) [R06.02 (ICD-10-CM)]; Essential hypertension [I10 (ICD-10-CM)]; Cerebral infarction R MCA due to occlusion of right carotid artery (HCC) s/p tPA and partial revascularization w/ R ICA stent placement [id number] (ICD-10-CM)]; Chronic obstructive pulmonary disease, unspecified COPD type ([place]) [J44.9 (ICD-10-CM)]  History:        Patient has prior history of Echocardiogram examinations, most recent 11/12/2019. COPD; Risk Factors:Hypertension.  Sonographer:    Luane School RDCS Referring Phys: [id number] Richardo Priest   Sonographer Comments: Global longitudinal strain was attempted. Contrast needed but not used due to patient condition. IMPRESSIONS   1. Left ventricular ejection fraction, by estimation, is 40 to 45%. The left ventricle has mildly decreased function. The left ventricle has no regional wall motion abnormalities. Left ventricular diastolic parameters are consistent with Grade I diastolic dysfunction (impaired relaxation). 2. Right ventricular systolic function is normal. The right ventricular  size is normal. There is normal pulmonary artery systolic pressure. 3. The mitral valve is normal in structure. No evidence of mitral valve regurgitation. No evidence of mitral stenosis. 4. The aortic valve is normal in structure. Aortic valve regurgitation is not visualized. No aortic stenosis is present. 5. The inferior vena cava is normal in size with greater than 50% respiratory variability, suggesting right atrial pressure of 3 mmHg.  FINDINGS Left Ventricle: Left ventricular ejection fraction, by estimation, is 40 to 45%. The left ventricle has mildly decreased function. The left ventricle has no regional wall motion abnormalities. The left ventricular internal cavity size was normal in size. There is no left ventricular hypertrophy. Left ventricular diastolic parameters are consistent with Grade I diastolic dysfunction (impaired relaxation).  Right Ventricle: The right ventricular size is normal. No increase in right ventricular wall thickness. Right ventricular systolic function is normal. There is normal pulmonary artery systolic pressure. The tricuspid regurgitant velocity is 1.55 m/s, and with an assumed right atrial pressure of 8 mmHg, the estimated right ventricular systolic pressure is [id number] mmHg.  Left Atrium: Left atrial size was normal in size.  Right Atrium: Right atrial size was normal in size.  Pericardium: There is no evidence of pericardial effusion.  Mitral Valve: The mitral valve is normal in structure. No evidence of mitral valve regurgitation. No evidence of mitral valve stenosis.  Tricuspid Valve: The tricuspid valve is normal in structure. Tricuspid valve regurgitation is mild . No evidence of tricuspid stenosis.  Aortic Valve: The aortic valve is normal in structure. Aortic valve regurgitation is not visualized. No aortic stenosis is present.  Pulmonic Valve: The pulmonic valve was normal in structure. Pulmonic valve regurgitation is not visualized. No evidence of  pulmonic stenosis.  Aorta: The aortic root is normal in size and structure.  Venous: The inferior vena cava is normal in size with greater than 50% respiratory variability, suggesting right atrial pressure of 3 mmHg.  IAS/Shunts: No atrial level shunt detected by color flow Doppler.   LEFT VENTRICLE PLAX 2D LVIDd:         5.00 cm      Diastology LVIDs:         4.30 cm      LV e' medial:    7.94 cm/s LV  PW:         0.90 cm      LV E/e' medial:  7.7 LV IVS:        0.90 cm      LV e' lateral:   7.51 cm/s LVOT diam:     2.00 cm      LV E/e' lateral: 8.1 LV SV:         53 LV SV Index:   28 LVOT Area:     3.14 cm  LV Volumes (MOD) LV vol d, MOD A2C: 102.0 ml LV vol d, MOD A4C: 114.0 ml LV vol s, MOD A2C: 55.7 ml LV vol s, MOD A4C: 63.4 ml LV SV MOD A2C:     46.3 ml LV SV MOD A4C:     114.0 ml LV SV MOD BP:      54.7 ml  RIGHT VENTRICLE             IVC RV S prime:     10.10 cm/s  IVC diam: 2.10 cm TAPSE (M-mode): 1.3 cm  LEFT ATRIUM             Index        RIGHT ATRIUM           Index LA diam:        3.50 cm 1.86 cm/m   RA Area:     13.50 cm LA Vol (A2C):   50.9 ml 27.01 ml/m  RA Volume:   29.20 ml  15.49 ml/m LA Vol (A4C):   38.5 ml 20.43 ml/m LA Biplane Vol: 43.7 ml 23.19 ml/m AORTIC VALVE LVOT Vmax:   101.00 cm/s LVOT Vmean:  62.600 cm/s LVOT VTI:    0.169 m  AORTA Ao Root diam: 2.80 cm Ao Asc diam:  3.30 cm Ao Desc diam: 2.60 cm  MITRAL VALVE               TRICUSPID VALVE MV Area (PHT): 3.97 cm    TR Peak grad:   9.6 mmHg MV Decel Time: 191 msec    TR Vmax:        155.00 cm/s MV E velocity: 60.80 cm/s MV A velocity: 83.60 cm/s  SHUNTS MV E/A ratio:  0.73        Systemic VTI:  0.17 m Systemic Diam: 2.00 cm  Jenne Campus MD Electronically signed by Jenne Campus MD Signature Date/Time: 06/04/2022/12:14:36 PM    Final             EKG:  EKG ordered today and personally reviewed.  The ekg ordered today demonstrates norma  Recent  Labs: 05/22/2022: BUN 26; Creatinine, Ser 1.19; Hemoglobin 12.0; NT-Pro BNP 1,874; Platelets 285; Potassium 5.0; Sodium 142  Recent Lipid Panel    Component Value Date/Time   CHOL 193 11/12/2019 0445   TRIG 71 11/12/2019 0445   HDL 51 11/12/2019 0445   CHOLHDL 3.8 11/12/2019 0445   VLDL 14 11/12/2019 0445   LDLCALC 128 (H) 11/12/2019 0445    Physical Exam:    VS:  BP 106/60 (Patient Position: Sitting)   Pulse (!) 58   Ht 6\' 2"  (1.88 m)   Wt 137 lb (62.1 kg)   SpO2 92%   BMI 17.59 kg/m     Wt Readings from Last 3 Encounters:  07/09/22 137 lb (62.1 kg)  05/22/22 143 lb (64.9 kg)  11/11/19 153 lb 10.6 oz (69.7 kg)     GEN: Tall thin obvious wheezing without a stethoscope upon  entering the room well nourished, well developed in no acute distress HEENT: Normal NECK: No JVD; No carotid bruits LYMPHATICS: No lymphadenopathy CARDIAC: Diminished breath sounds RRR, no murmurs, rubs, gallops RESPIRATORY: Hyperinflated diminished breath sounds expiratory wheezing ABDOMEN: Soft, non-tender, non-distended MUSCULOSKELETAL:  No edema; No deformity  SKIN: Warm and dry NEUROLOGIC:  Alert and oriented x 3 PSYCHIATRIC:  Normal affect    Signed, Shirlee More, MD  07/09/2022 7:56 AM    [place]

## 2022-07-09 ENCOUNTER — Encounter: Payer: Self-pay | Admitting: Cardiology

## 2022-07-09 ENCOUNTER — Other Ambulatory Visit: Payer: Self-pay

## 2022-07-09 ENCOUNTER — Ambulatory Visit: Payer: Medicare HMO | Attending: Cardiology | Admitting: Cardiology

## 2022-07-09 VITALS — BP 106/60 | HR 58 | Ht 74.0 in | Wt 137.0 lb

## 2022-07-09 DIAGNOSIS — I10 Essential (primary) hypertension: Secondary | ICD-10-CM

## 2022-07-09 DIAGNOSIS — I110 Hypertensive heart disease with heart failure: Secondary | ICD-10-CM

## 2022-07-09 DIAGNOSIS — I63231 Cerebral infarction due to unspecified occlusion or stenosis of right carotid arteries: Secondary | ICD-10-CM

## 2022-07-09 DIAGNOSIS — R0602 Shortness of breath: Secondary | ICD-10-CM | POA: Diagnosis not present

## 2022-07-09 DIAGNOSIS — I5042 Chronic combined systolic (congestive) and diastolic (congestive) heart failure: Secondary | ICD-10-CM

## 2022-07-09 DIAGNOSIS — J449 Chronic obstructive pulmonary disease, unspecified: Secondary | ICD-10-CM

## 2022-07-09 MED ORDER — ASPIRIN 81 MG PO TBEC: 81.0000 mg | DELAYED_RELEASE_TABLET | Freq: Every day | ORAL | 3 refills | Status: DC

## 2022-07-09 MED ORDER — DAPAGLIFLOZIN PROPANEDIOL 10 MG PO TABS: 10.0000 mg | ORAL_TABLET | Freq: Every day | ORAL | 3 refills | Status: DC

## 2022-07-09 MED ORDER — ENTRESTO 24-26 MG PO TABS: 1.0000 | ORAL_TABLET | Freq: Two times a day (BID) | ORAL | 3 refills | Status: DC

## 2022-07-09 MED ORDER — FUROSEMIDE 20 MG PO TABS: 20.0000 mg | ORAL_TABLET | Freq: Every day | ORAL | 3 refills | Status: DC

## 2022-07-09 MED ORDER — ALBUTEROL SULFATE HFA 108 (90 BASE) MCG/ACT IN AERS: 2.0000 | INHALATION_SPRAY | Freq: Four times a day (QID) | RESPIRATORY_TRACT | 2 refills | Status: DC | PRN

## 2022-07-09 NOTE — Addendum Note (Signed)
Addended by: Edwyna Shell I on: 07/09/2022 08:27 AM   Modules accepted: Orders

## 2022-07-09 NOTE — Addendum Note (Signed)
Addended by: Edwyna Shell I on: 07/09/2022 08:32 AM   Modules accepted: Orders

## 2022-07-09 NOTE — Patient Instructions (Signed)
Medication Instructions:  Your physician has recommended you make the following change in your medication:   STOP: Brilinta STOP: Verapamil START: Aspirin 81 mg daily START: Atorvastatin 40 mg daily START: Proventyl MDI 2 puffs four times daily  *If you need a refill on your cardiac medications before your next appointment, please call your pharmacy*   Lab Work: Your physician recommends that you return for lab work in:   Labs today: BMP, Pro BNP  If you have labs (blood work) drawn today and your tests are completely normal, you will receive your results only by: [place] (if you have MyChart) OR A paper copy in the mail If you have any lab test that is abnormal or we need to change your treatment, we will call you to review the results.   Testing/Procedures: Non-Cardiac CT scanning, (CAT scanning), is a noninvasive, special x-ray that produces cross-sectional images of the body using x-rays and a computer. CT scans help physicians diagnose and treat medical conditions. For some CT exams, a contrast material is used to enhance visibility in the area of the body being studied. CT scans provide greater clarity and reveal more details than regular x-ray exams.    Follow-Up: At [facility], you and your health needs are our priority.  As part of our continuing mission to provide you with exceptional heart care, we have created designated Provider Care Teams.  These Care Teams include your primary Cardiologist (physician) and Advanced Practice Providers (APPs -  Physician Assistants and Nurse Practitioners) who all work together to provide you with the care you need, when you need it.  We recommend signing up for the patient portal called "MyChart".  Sign up information is provided on this After Visit Summary.  MyChart is used to connect with patients for Virtual Visits (Telemedicine).  Patients are able to view lab/test results, encounter notes, upcoming appointments, etc.   Non-urgent messages can be sent to your provider as well.   To learn more about what you can do with MyChart, go to [email or web address].    Your next appointment:   3 month(s)  Provider:   Shirlee More, MD    Other Instructions None

## 2022-07-10 ENCOUNTER — Telehealth: Payer: Self-pay | Admitting: Cardiology

## 2022-07-10 LAB — BASIC METABOLIC PANEL
BUN/Creatinine Ratio: 26 — ABNORMAL HIGH (ref 10–24)
BUN: 41 mg/dL — ABNORMAL HIGH (ref 8–27)
CO2: 21 mmol/L (ref 20–29)
Calcium: 8.9 mg/dL (ref 8.6–10.2)
Chloride: 101 mmol/L (ref 96–106)
Creatinine, Ser: 1.59 mg/dL — ABNORMAL HIGH (ref 0.76–1.27)
Glucose: 83 mg/dL (ref 70–99)
Potassium: 4.8 mmol/L (ref 3.5–5.2)
Sodium: 139 mmol/L (ref 134–144)
eGFR: 43 mL/min/{1.73_m2} — ABNORMAL LOW (ref >59)

## 2022-07-10 LAB — PRO B NATRIURETIC PEPTIDE: NT-Pro BNP: 750 pg/mL — ABNORMAL HIGH (ref 0–486)

## 2022-07-10 NOTE — Telephone Encounter (Signed)
Patient's daughter is returning call to discuss lab results. 

## 2022-07-10 NOTE — Telephone Encounter (Signed)
Patient's daughter informed of results.  

## 2022-07-27 ENCOUNTER — Ambulatory Visit (HOSPITAL_BASED_OUTPATIENT_CLINIC_OR_DEPARTMENT_OTHER): Payer: Medicare HMO

## 2022-08-09 ENCOUNTER — Encounter (HOSPITAL_BASED_OUTPATIENT_CLINIC_OR_DEPARTMENT_OTHER): Payer: Self-pay

## 2022-08-09 ENCOUNTER — Ambulatory Visit (HOSPITAL_BASED_OUTPATIENT_CLINIC_OR_DEPARTMENT_OTHER)
Admission: RE | Admit: 2022-08-09 | Discharge: 2022-08-09 | Disposition: A | Discharge status: Home / Self Care | Payer: Medicare HMO | Source: Ambulatory Visit | Attending: Cardiology | Admitting: Cardiology

## 2022-08-09 DIAGNOSIS — I110 Hypertensive heart disease with heart failure: Secondary | ICD-10-CM | POA: Insufficient documentation

## 2022-08-09 DIAGNOSIS — J479 Bronchiectasis, uncomplicated: Secondary | ICD-10-CM | POA: Diagnosis not present

## 2022-08-09 DIAGNOSIS — R0602 Shortness of breath: Secondary | ICD-10-CM | POA: Diagnosis not present

## 2022-08-09 DIAGNOSIS — J449 Chronic obstructive pulmonary disease, unspecified: Secondary | ICD-10-CM | POA: Insufficient documentation

## 2022-08-09 DIAGNOSIS — J432 Centrilobular emphysema: Secondary | ICD-10-CM | POA: Diagnosis not present

## 2022-08-09 DIAGNOSIS — I5042 Chronic combined systolic (congestive) and diastolic (congestive) heart failure: Secondary | ICD-10-CM | POA: Insufficient documentation

## 2022-08-09 DIAGNOSIS — I63231 Cerebral infarction due to unspecified occlusion or stenosis of right carotid arteries: Secondary | ICD-10-CM | POA: Diagnosis not present

## 2022-08-15 DIAGNOSIS — D6869 Other thrombophilia: Secondary | ICD-10-CM | POA: Diagnosis not present

## 2022-08-15 DIAGNOSIS — A310 Pulmonary mycobacterial infection: Secondary | ICD-10-CM | POA: Diagnosis not present

## 2022-08-15 DIAGNOSIS — J418 Mixed simple and mucopurulent chronic bronchitis: Secondary | ICD-10-CM | POA: Diagnosis not present

## 2022-08-15 DIAGNOSIS — J471 Bronchiectasis with (acute) exacerbation: Secondary | ICD-10-CM | POA: Diagnosis not present

## 2022-08-15 DIAGNOSIS — E785 Hyperlipidemia, unspecified: Secondary | ICD-10-CM | POA: Diagnosis not present

## 2022-08-15 DIAGNOSIS — T466X5A Adverse effect of antihyperlipidemic and antiarteriosclerotic drugs, initial encounter: Secondary | ICD-10-CM | POA: Diagnosis not present

## 2022-08-15 DIAGNOSIS — I700 Atherosclerosis of aorta: Secondary | ICD-10-CM | POA: Diagnosis not present

## 2022-08-15 DIAGNOSIS — G720 Drug-induced myopathy: Secondary | ICD-10-CM | POA: Diagnosis not present

## 2022-08-15 DIAGNOSIS — J438 Other emphysema: Secondary | ICD-10-CM | POA: Diagnosis not present

## 2022-08-15 DIAGNOSIS — J432 Centrilobular emphysema: Secondary | ICD-10-CM | POA: Diagnosis not present

## 2022-08-15 DIAGNOSIS — I480 Paroxysmal atrial fibrillation: Secondary | ICD-10-CM | POA: Diagnosis not present

## 2022-08-15 DIAGNOSIS — Z79899 Other long term (current) drug therapy: Secondary | ICD-10-CM | POA: Diagnosis not present

## 2022-08-16 ENCOUNTER — Other Ambulatory Visit: Payer: Self-pay

## 2022-08-17 DIAGNOSIS — H52221 Regular astigmatism, right eye: Secondary | ICD-10-CM | POA: Diagnosis not present

## 2022-08-17 DIAGNOSIS — H524 Presbyopia: Secondary | ICD-10-CM | POA: Diagnosis not present

## 2022-09-26 DIAGNOSIS — M67431 Ganglion, right wrist: Secondary | ICD-10-CM | POA: Diagnosis not present

## 2022-10-11 NOTE — Progress Notes (Signed)
Cardiology Office Note:    Date:  10/12/2022   ID:  Ryan Hodge, DOB 02-Dec-1935, MRN [id number]  PCP:  Street, Stephanie Coup, MD  Cardiologist:  Norman Herrlich, MD    Referring MD: [address] Stephanie Coup, *    ASSESSMENT:    1. Hypertensive heart disease with chronic combined systolic and diastolic congestive heart failure (HCC)   2. Chronic obstructive pulmonary disease, unspecified COPD type (HCC)   3. Cerebral infarction R MCA due to occlusion of right carotid artery (HCC) s/p tPA and partial revascularization w/ R ICA stent placement   4. Essential hypertension   5. Mixed hyperlipidemia    PLAN:    In order of problems listed above:  Clinically I think he is symptomatic from hypotension he has no fluid overload I am going to have him take his diuretic as needed especially as he takes an SGLT2 inhibitor and reduce his Entresto to once daily maximum tolerated dose He tells me he has an upcoming visit with his PCP for repeat labs with his CKD He has obvious severe COPD and is quite bronchospastic it will continue his current diuretics With stroke he needs to continue lipid-lowering therapy and atorvastatin along with aspirin   Next appointment: 6 months   Medication Adjustments/Labs and Tests Ordered: Current medicines are reviewed at length with the patient today.  Concerns regarding medicines are outlined above.  No orders of the defined types were placed in this encounter.  No orders of the defined types were placed in this encounter.    History of Present Illness:    Ryan Hodge is a 87 y.o. male with a hx of hypertension stroke with right middle carotid artery stent and COPD last seen 07/09/2022.  His echocardiogram showed LV dysfunction EF 40 to 45% and elevated proBNP level and have been placed on a diuretic for heart failure.  Compliance with diet, lifestyle and medications: Yes  His daughter has been staying with him but she is in the hospital  in [place] with pneumonia He struggles with short of breath for any activity even ADLs but is still able to take care of himself His primary concern is weakness He does not check blood pressure at home He has no edema today his blood pressure is relatively low.  Change his diuretic to only as needed for edema and reduce his Entresto to once daily.  I think it will improve the quality of his life No chest pain palpitation or syncope Recent labs 08/15/2022 cholesterol 182 triglycerides 184 A1c 5.5 hemoglobin 11.3 creatinine 1.8 potassium 4.8 Past Medical History:  Diagnosis Date   COPD (chronic obstructive pulmonary disease) (HCC)    Hypertension     Past Surgical History:  Procedure Laterality Date   ESOPHAGOGASTRODUODENOSCOPY N/A 07/29/2016   Procedure: ESOPHAGOGASTRODUODENOSCOPY (EGD);  Surgeon: Charna Elizabeth, MD;  Location: [facility] ENDOSCOPY;  Service: Endoscopy;  Laterality: N/A;   IR INTRAVSC STENT CERV CAROTID W/O EMB-PROT MOD SED INC ANGIO  11/11/2019   IR PERCUTANEOUS ART THROMBECTOMY/INFUSION INTRACRANIAL INC DIAG ANGIO  11/11/2019   RADIOLOGY WITH ANESTHESIA N/A 11/11/2019   Procedure: IR WITH ANESTHESIA - CODE STROKE;  Surgeon: Julieanne Cotton, MD;  Location: [facility] OR;  Service: Radiology;  Laterality: N/A;    Current Medications: Current Meds  Medication Sig   albuterol (VENTOLIN HFA) 108 (90 Base) MCG/ACT inhaler Inhale 2 puffs into the lungs every 6 (six) hours as needed for wheezing or shortness of breath.   aspirin EC 81 MG tablet Take  1 tablet (81 mg total) by mouth daily. Swallow whole.   atorvastatin (LIPITOR) 40 MG tablet Take 1 tablet (40 mg total) by mouth daily.   dapagliflozin propanediol (FARXIGA) 10 MG TABS tablet Take 1 tablet (10 mg total) by mouth daily before breakfast.   sacubitril-valsartan (ENTRESTO) 24-26 MG Take 1 tablet by mouth 2 (two) times daily.   [DISCONTINUED] furosemide (LASIX) 20 MG tablet Take 1 tablet (20 mg total) by mouth daily.     Allergies:    Patient has no known allergies.   EKGs/Labs/Other Studies Reviewed:    The following studies were reviewed today:  Cardiac Studies & Procedures       ECHOCARDIOGRAM  ECHOCARDIOGRAM COMPLETE 06/04/2022  Narrative ECHOCARDIOGRAM REPORT    Patient Name:   Ryan Hodge Date of Exam: 06/04/2022 Medical Rec #:  [id number]             Height:       74.0 in Accession #:    [id number]            Weight:       143.0 lb Date of Birth:  [birth year]            BSA:          1.885 m Patient Age:    86 years              BP:           172/76 mmHg Patient Gender: M                     HR:           81 bpm. Exam Location:  [facility]  Procedure: 2D Echo, Cardiac Doppler and Color Doppler  Indications:    SOB (shortness of breath) [R06.02 (ICD-10-CM)]; Essential hypertension [I10 (ICD-10-CM)]; Cerebral infarction R MCA due to occlusion of right carotid artery (HCC) s/p tPA and partial revascularization w/ R ICA stent placement [W29.562 (ICD-10-CM)]; Chronic obstructive pulmonary disease, unspecified COPD type (HCC) [J44.9 (ICD-10-CM)]  History:        Patient has prior history of Echocardiogram examinations, most recent 11/12/2019. COPD; Risk Factors:Hypertension.  Sonographer:    Louie Boston RDCS Referring Phys: [id number] Baldo Daub   Sonographer Comments: Global longitudinal strain was attempted. Contrast needed but not used due to patient condition. IMPRESSIONS   1. Left ventricular ejection fraction, by estimation, is 40 to 45%. The left ventricle has mildly decreased function. The left ventricle has no regional wall motion abnormalities. Left ventricular diastolic parameters are consistent with Grade I diastolic dysfunction (impaired relaxation). 2. Right ventricular systolic function is normal. The right ventricular size is normal. There is normal pulmonary artery systolic pressure. 3. The mitral valve is normal in structure. No evidence of mitral valve regurgitation. No  evidence of mitral stenosis. 4. The aortic valve is normal in structure. Aortic valve regurgitation is not visualized. No aortic stenosis is present. 5. The inferior vena cava is normal in size with greater than 50% respiratory variability, suggesting right atrial pressure of 3 mmHg.  FINDINGS Left Ventricle: Left ventricular ejection fraction, by estimation, is 40 to 45%. The left ventricle has mildly decreased function. The left ventricle has no regional wall motion abnormalities. The left ventricular internal cavity size was normal in size. There is no left ventricular hypertrophy. Left ventricular diastolic parameters are consistent with Grade I diastolic dysfunction (impaired relaxation).  Right Ventricle: The right ventricular size is normal. No increase in right  ventricular wall thickness. Right ventricular systolic function is normal. There is normal pulmonary artery systolic pressure. The tricuspid regurgitant velocity is 1.55 m/s, and with an assumed right atrial pressure of 8 mmHg, the estimated right ventricular systolic pressure is 17.6 mmHg.  Left Atrium: Left atrial size was normal in size.  Right Atrium: Right atrial size was normal in size.  Pericardium: There is no evidence of pericardial effusion.  Mitral Valve: The mitral valve is normal in structure. No evidence of mitral valve regurgitation. No evidence of mitral valve stenosis.  Tricuspid Valve: The tricuspid valve is normal in structure. Tricuspid valve regurgitation is mild . No evidence of tricuspid stenosis.  Aortic Valve: The aortic valve is normal in structure. Aortic valve regurgitation is not visualized. No aortic stenosis is present.  Pulmonic Valve: The pulmonic valve was normal in structure. Pulmonic valve regurgitation is not visualized. No evidence of pulmonic stenosis.  Aorta: The aortic root is normal in size and structure.  Venous: The inferior vena cava is normal in size with greater than 50%  respiratory variability, suggesting right atrial pressure of 3 mmHg.  IAS/Shunts: No atrial level shunt detected by color flow Doppler.   LEFT VENTRICLE PLAX 2D LVIDd:         5.00 cm      Diastology LVIDs:         4.30 cm      LV e' medial:    7.94 cm/s LV PW:         0.90 cm      LV E/e' medial:  7.7 LV IVS:        0.90 cm      LV e' lateral:   7.51 cm/s LVOT diam:     2.00 cm      LV E/e' lateral: 8.1 LV SV:         53 LV SV Index:   28 LVOT Area:     3.14 cm  LV Volumes (MOD) LV vol d, MOD A2C: 102.0 ml LV vol d, MOD A4C: 114.0 ml LV vol s, MOD A2C: 55.7 ml LV vol s, MOD A4C: 63.4 ml LV SV MOD A2C:     46.3 ml LV SV MOD A4C:     114.0 ml LV SV MOD BP:      54.7 ml  RIGHT VENTRICLE             IVC RV S prime:     10.10 cm/s  IVC diam: 2.10 cm TAPSE (M-mode): 1.3 cm  LEFT ATRIUM             Index        RIGHT ATRIUM           Index LA diam:        3.50 cm 1.86 cm/m   RA Area:     13.50 cm LA Vol (A2C):   50.9 ml 27.01 ml/m  RA Volume:   29.20 ml  15.49 ml/m LA Vol (A4C):   38.5 ml 20.43 ml/m LA Biplane Vol: 43.7 ml 23.19 ml/m AORTIC VALVE LVOT Vmax:   101.00 cm/s LVOT Vmean:  62.600 cm/s LVOT VTI:    0.169 m  AORTA Ao Root diam: 2.80 cm Ao Asc diam:  3.30 cm Ao Desc diam: 2.60 cm  MITRAL VALVE               TRICUSPID VALVE MV Area (PHT): 3.97 cm    TR Peak grad:   9.6 mmHg MV  Decel Time: 191 msec    TR Vmax:        155.00 cm/s MV E velocity: 60.80 cm/s MV A velocity: 83.60 cm/s  SHUNTS MV E/A ratio:  0.73        Systemic VTI:  0.17 m Systemic Diam: 2.00 cm  Gypsy Balsam MD Electronically signed by Gypsy Balsam MD Signature Date/Time: 06/04/2022/12:14:36 PM    Final                 Recent Labs: 05/22/2022: Hemoglobin 12.0; Platelets 285 07/09/2022: BUN 41; Creatinine, Ser 1.59; NT-Pro BNP 750; Potassium 4.8; Sodium 139  Recent Lipid Panel    Component Value Date/Time   CHOL 193 11/12/2019 0445   TRIG 71 11/12/2019 0445   HDL 51  11/12/2019 0445   CHOLHDL 3.8 11/12/2019 0445   VLDL 14 11/12/2019 0445   LDLCALC 128 (H) 11/12/2019 0445    Physical Exam:    VS:  BP 100/60 (BP Location: Left Arm, Patient Position: Sitting, Cuff Size: Normal)   Pulse 97   Ht 6\' 2"  (1.88 m)   Wt 137 lb 3.2 oz (62.2 kg)   SpO2 98%   BMI 17.62 kg/m     Wt Readings from Last 3 Encounters:  10/12/22 137 lb 3.2 oz (62.2 kg)  07/09/22 137 lb (62.1 kg)  05/22/22 143 lb (64.9 kg)     GEN: COPD appearance tall thin he appears breathless but his oxygen saturation is normal HEENT: Normal NECK: No JVD; No carotid bruits LYMPHATICS: No lymphadenopathy CARDIAC: Distant heart sounds RRR, no murmurs, rubs, gallops RESPIRATORY: Breath sounds he has marked expiratory wheezing ABDOMEN: Soft, non-tender, non-distended MUSCULOSKELETAL:  No edema; No deformity  SKIN: Warm and dry NEUROLOGIC:  Alert and oriented x 3 PSYCHIATRIC:  Normal affect    Signed, Norman Herrlich, MD  10/12/2022 11:22 AM    [facility]

## 2022-10-12 ENCOUNTER — Ambulatory Visit: Payer: Medicare HMO | Attending: Cardiology | Admitting: Cardiology

## 2022-10-12 ENCOUNTER — Encounter: Payer: Self-pay | Admitting: Cardiology

## 2022-10-12 VITALS — BP 100/60 | HR 97 | Ht 74.0 in | Wt 137.2 lb

## 2022-10-12 DIAGNOSIS — I110 Hypertensive heart disease with heart failure: Secondary | ICD-10-CM

## 2022-10-12 DIAGNOSIS — E782 Mixed hyperlipidemia: Secondary | ICD-10-CM | POA: Diagnosis not present

## 2022-10-12 DIAGNOSIS — J449 Chronic obstructive pulmonary disease, unspecified: Secondary | ICD-10-CM | POA: Diagnosis not present

## 2022-10-12 DIAGNOSIS — I63231 Cerebral infarction due to unspecified occlusion or stenosis of right carotid arteries: Secondary | ICD-10-CM | POA: Diagnosis not present

## 2022-10-12 DIAGNOSIS — I5042 Chronic combined systolic (congestive) and diastolic (congestive) heart failure: Secondary | ICD-10-CM

## 2022-10-12 DIAGNOSIS — I10 Essential (primary) hypertension: Secondary | ICD-10-CM | POA: Diagnosis not present

## 2022-10-12 MED ORDER — FUROSEMIDE 20 MG PO TABS: 20.0000 mg | ORAL_TABLET | Freq: Every day | ORAL | 3 refills | Status: DC | PRN

## 2022-10-12 MED ORDER — ENTRESTO 24-26 MG PO TABS: 1.0000 | ORAL_TABLET | Freq: Every day | ORAL | 3 refills | Status: DC

## 2022-10-12 NOTE — Patient Instructions (Signed)
Medication Instructions:  Your physician has recommended you make the following change in your medication:   START: Furosemide 20 mg daily as needed for edema START: Entresto 24/26 1 tablet daily  *If you need a refill on your cardiac medications before your next appointment, please call your pharmacy*   Lab Work: None If you have labs (blood work) drawn today and your tests are completely normal, you will receive your results only by: MyChart Message (if you have MyChart) OR A paper copy in the mail If you have any lab test that is abnormal or we need to change your treatment, we will call you to review the results.   Testing/Procedures: None   Follow-Up: At [facility], you and your health needs are our priority.  As part of our continuing mission to provide you with exceptional heart care, we have created designated Provider Care Teams.  These Care Teams include your primary Cardiologist (physician) and Advanced Practice Providers (APPs -  Physician Assistants and Nurse Practitioners) who all work together to provide you with the care you need, when you need it.  We recommend signing up for the patient portal called "MyChart".  Sign up information is provided on this After Visit Summary.  MyChart is used to connect with patients for Virtual Visits (Telemedicine).  Patients are able to view lab/test results, encounter notes, upcoming appointments, etc.  Non-urgent messages can be sent to your provider as well.   To learn more about what you can do with MyChart, go to [email or web address].    Your next appointment:   6 month(s)  Provider:   Norman Herrlich, MD    Other Instructions None

## 2022-10-17 DIAGNOSIS — D485 Neoplasm of uncertain behavior of skin: Secondary | ICD-10-CM | POA: Diagnosis not present

## 2022-11-06 DIAGNOSIS — D485 Neoplasm of uncertain behavior of skin: Secondary | ICD-10-CM | POA: Diagnosis not present

## 2022-11-23 DIAGNOSIS — D485 Neoplasm of uncertain behavior of skin: Secondary | ICD-10-CM | POA: Diagnosis not present

## 2022-11-23 DIAGNOSIS — L02413 Cutaneous abscess of right upper limb: Secondary | ICD-10-CM | POA: Diagnosis not present

## 2022-11-23 DIAGNOSIS — L0100 Impetigo, unspecified: Secondary | ICD-10-CM | POA: Diagnosis not present

## 2023-01-16 DIAGNOSIS — L814 Other melanin hyperpigmentation: Secondary | ICD-10-CM | POA: Diagnosis not present

## 2023-01-16 DIAGNOSIS — L578 Other skin changes due to chronic exposure to nonionizing radiation: Secondary | ICD-10-CM | POA: Diagnosis not present

## 2023-01-16 DIAGNOSIS — L570 Actinic keratosis: Secondary | ICD-10-CM | POA: Diagnosis not present

## 2023-02-04 DIAGNOSIS — L570 Actinic keratosis: Secondary | ICD-10-CM | POA: Diagnosis not present

## 2023-02-04 DIAGNOSIS — L0100 Impetigo, unspecified: Secondary | ICD-10-CM | POA: Diagnosis not present

## 2023-04-05 DIAGNOSIS — N471 Phimosis: Secondary | ICD-10-CM | POA: Diagnosis not present

## 2023-04-15 DIAGNOSIS — J418 Mixed simple and mucopurulent chronic bronchitis: Secondary | ICD-10-CM | POA: Diagnosis not present

## 2023-04-15 DIAGNOSIS — E785 Hyperlipidemia, unspecified: Secondary | ICD-10-CM | POA: Diagnosis not present

## 2023-04-15 DIAGNOSIS — J471 Bronchiectasis with (acute) exacerbation: Secondary | ICD-10-CM | POA: Diagnosis not present

## 2023-04-15 DIAGNOSIS — A310 Pulmonary mycobacterial infection: Secondary | ICD-10-CM | POA: Diagnosis not present

## 2023-04-15 DIAGNOSIS — J438 Other emphysema: Secondary | ICD-10-CM | POA: Diagnosis not present

## 2023-04-15 DIAGNOSIS — J9611 Chronic respiratory failure with hypoxia: Secondary | ICD-10-CM | POA: Diagnosis not present

## 2023-04-15 DIAGNOSIS — J432 Centrilobular emphysema: Secondary | ICD-10-CM | POA: Diagnosis not present

## 2023-04-15 DIAGNOSIS — Z0000 Encounter for general adult medical examination without abnormal findings: Secondary | ICD-10-CM | POA: Diagnosis not present

## 2023-04-15 DIAGNOSIS — I480 Paroxysmal atrial fibrillation: Secondary | ICD-10-CM | POA: Diagnosis not present

## 2023-04-15 DIAGNOSIS — D6869 Other thrombophilia: Secondary | ICD-10-CM | POA: Diagnosis not present

## 2023-04-15 DIAGNOSIS — N471 Phimosis: Secondary | ICD-10-CM | POA: Diagnosis not present

## 2023-04-15 DIAGNOSIS — G720 Drug-induced myopathy: Secondary | ICD-10-CM | POA: Diagnosis not present

## 2023-05-14 ENCOUNTER — Encounter: Payer: Self-pay | Admitting: Cardiology

## 2023-05-15 NOTE — Progress Notes (Unsigned)
Cardiology Office Note:    Date:  05/16/2023   ID:  Ryan Hodge, DOB 01-Jun-1935, MRN [id number]  PCP:  Street, Stephanie Coup, MD  Cardiologist:  Norman Herrlich, MD    Referring MD: [address], Stephanie Coup, *    ASSESSMENT:    1. Hypertensive heart disease with chronic combined systolic and diastolic congestive heart failure (HCC)   2. Chronic obstructive pulmonary disease, unspecified COPD type (HCC)   3. Mixed hyperlipidemia    PLAN:    In order of problems listed above:  Despite the severity of his lung disease he seems to be doing well he has no edema he is on good guideline directed therapy including Entresto which is tolerated I will note his loop diuretic and will recheck labs including a proBNP today.  He has not taken beta-blocker because of the severity of his lung disease and bronchospasm and will continue the Entresto low-dose 2426 furosemide 20 mg daily as needed and Farxiga 10 mg/day.  If renal function potassium are normal consider adding spironolactone Obvious severe COPD continue his current bronchodilator albuterol 2 puffs every 6 hours as needed He is on statin Lipitor 40 mg daily check his liver function lipid profile   Next appointment: 6 months   Medication Adjustments/Labs and Tests Ordered: Current medicines are reviewed at length with the patient today.  Concerns regarding medicines are outlined above.  Orders Placed This Encounter  Procedures   EKG 12-Lead   No orders of the defined types were placed in this encounter.    History of Present Illness:    Ryan Hodge is a 88 y.o. male with a hx of hypertension with heart failure EF 40 to 45% stroke with right middle cerebral artery stent and COPD last seen 10/12/2022.  He had a chest CT performed April 2024 but showed severe centrilobular and paraseptal emphysema and findings of bronchiectasis cavitation and postinfectious inflammatory scarring. Compliance with diet, lifestyle and  medications: Yes  He feels he is doing well usual shortness of breath edema or chest pain bowel potation syncope He does not check blood pressure at the house Past Medical History:  Diagnosis Date   Acute blood loss anemia 11/13/2019   Cerebral infarction R MCA due to occlusion of right carotid artery (HCC) s/p tPA and partial revascularization w/ R ICA stent placement 11/11/2019   COPD (chronic obstructive pulmonary disease) (HCC)    Essential hypertension 11/13/2019   Family hx-stroke 11/13/2019   Hyperlipidemia 11/13/2019   Hypertension    ICAO (internal carotid artery occlusion), right s/p stent 11/13/2019   Pneumonia due to COVID-19 virus 01/18/2019   Last Assessment & Plan: Formatting of this note might be different from the original. Patient was diagnosed on 01/18/2019 at [facility] in [place]. Subsequently transferred to [facility] on 10/4. Patient is requiring 2 L O2 via nasal cannula D-dimer 909 >> 990 ; ferritin 59.6 >> 80 Pro-Cal <0.05 at OSH. CXR: multifocal infection, with interval development of right-sided PNA and par    Current Medications: Current Meds  Medication Sig   albuterol (VENTOLIN HFA) 108 (90 Base) MCG/ACT inhaler Inhale 2 puffs into the lungs every 6 (six) hours as needed for wheezing or shortness of breath.   aspirin EC 81 MG tablet Take 1 tablet (81 mg total) by mouth daily. Swallow whole.   atorvastatin (LIPITOR) 40 MG tablet Take 1 tablet (40 mg total) by mouth daily.   dapagliflozin propanediol (FARXIGA) 10 MG TABS tablet Take 1 tablet (10  mg total) by mouth daily before breakfast.   furosemide (LASIX) 20 MG tablet Take 1 tablet (20 mg total) by mouth daily as needed for edema.   sacubitril-valsartan (ENTRESTO) 24-26 MG Take 1 tablet by mouth daily.   verapamil (VERELAN) 120 MG 24 hr capsule Take 120 mg by mouth at bedtime.      EKGs/Labs/Other Studies Reviewed:    The following studies were reviewed today:  Cardiac Studies &  Procedures      ECHOCARDIOGRAM  ECHOCARDIOGRAM COMPLETE 06/04/2022  Narrative ECHOCARDIOGRAM REPORT    Patient Name:   [name] Date of Exam: 06/04/2022 Medical Rec #:  [id number]             Height:       74.0 in Accession #:    [id number]            Weight:       143.0 lb Date of Birth:  03/01/1936            BSA:          1.885 m Patient Age:    86 years              BP:           172/76 mmHg Patient Gender: M                     HR:           81 bpm. Exam Location:  [facility]  Procedure: 2D Echo, Cardiac Doppler and Color Doppler  Indications:    SOB (shortness of breath) [R06.02 (ICD-10-CM)]; Essential hypertension [I10 (ICD-10-CM)]; Cerebral infarction R MCA due to occlusion of right carotid artery (HCC) s/p tPA and partial revascularization w/ R ICA stent placement [W29.562 (ICD-10-CM)]; Chronic obstructive pulmonary disease, unspecified COPD type (HCC) [J44.9 (ICD-10-CM)]  History:        Patient has prior history of Echocardiogram examinations, most recent 11/12/2019. COPD; Risk Factors:Hypertension.  Sonographer:    Louie Boston RDCS Referring Phys: [id number] Baldo Daub   Sonographer Comments: Global longitudinal strain was attempted. Contrast needed but not used due to patient condition. IMPRESSIONS   1. Left ventricular ejection fraction, by estimation, is 40 to 45%. The left ventricle has mildly decreased function. The left ventricle has no regional wall motion abnormalities. Left ventricular diastolic parameters are consistent with Grade I diastolic dysfunction (impaired relaxation). 2. Right ventricular systolic function is normal. The right ventricular size is normal. There is normal pulmonary artery systolic pressure. 3. The mitral valve is normal in structure. No evidence of mitral valve regurgitation. No evidence of mitral stenosis. 4. The aortic valve is normal in structure. Aortic valve regurgitation is not visualized. No aortic stenosis is  present. 5. The inferior vena cava is normal in size with greater than 50% respiratory variability, suggesting right atrial pressure of 3 mmHg.  FINDINGS Left Ventricle: Left ventricular ejection fraction, by estimation, is 40 to 45%. The left ventricle has mildly decreased function. The left ventricle has no regional wall motion abnormalities. The left ventricular internal cavity size was normal in size. There is no left ventricular hypertrophy. Left ventricular diastolic parameters are consistent with Grade I diastolic dysfunction (impaired relaxation).  Right Ventricle: The right ventricular size is normal. No increase in right ventricular wall thickness. Right ventricular systolic function is normal. There is normal pulmonary artery systolic pressure. The tricuspid regurgitant velocity is 1.55 m/s, and with an assumed right atrial pressure of 8 mmHg,  the estimated right ventricular systolic pressure is 17.6 mmHg.  Left Atrium: Left atrial size was normal in size.  Right Atrium: Right atrial size was normal in size.  Pericardium: There is no evidence of pericardial effusion.  Mitral Valve: The mitral valve is normal in structure. No evidence of mitral valve regurgitation. No evidence of mitral valve stenosis.  Tricuspid Valve: The tricuspid valve is normal in structure. Tricuspid valve regurgitation is mild . No evidence of tricuspid stenosis.  Aortic Valve: The aortic valve is normal in structure. Aortic valve regurgitation is not visualized. No aortic stenosis is present.  Pulmonic Valve: The pulmonic valve was normal in structure. Pulmonic valve regurgitation is not visualized. No evidence of pulmonic stenosis.  Aorta: The aortic root is normal in size and structure.  Venous: The inferior vena cava is normal in size with greater than 50% respiratory variability, suggesting right atrial pressure of 3 mmHg.  IAS/Shunts: No atrial level shunt detected by color flow Doppler.   LEFT  VENTRICLE PLAX 2D LVIDd:         5.00 cm      Diastology LVIDs:         4.30 cm      LV e' medial:    7.94 cm/s LV PW:         0.90 cm      LV E/e' medial:  7.7 LV IVS:        0.90 cm      LV e' lateral:   7.51 cm/s LVOT diam:     2.00 cm      LV E/e' lateral: 8.1 LV SV:         53 LV SV Index:   28 LVOT Area:     3.14 cm  LV Volumes (MOD) LV vol d, MOD A2C: 102.0 ml LV vol d, MOD A4C: 114.0 ml LV vol s, MOD A2C: 55.7 ml LV vol s, MOD A4C: 63.4 ml LV SV MOD A2C:     46.3 ml LV SV MOD A4C:     114.0 ml LV SV MOD BP:      54.7 ml  RIGHT VENTRICLE             IVC RV S prime:     10.10 cm/s  IVC diam: 2.10 cm TAPSE (M-mode): 1.3 cm  LEFT ATRIUM             Index        RIGHT ATRIUM           Index LA diam:        3.50 cm 1.86 cm/m   RA Area:     13.50 cm LA Vol (A2C):   50.9 ml 27.01 ml/m  RA Volume:   29.20 ml  15.49 ml/m LA Vol (A4C):   38.5 ml 20.43 ml/m LA Biplane Vol: 43.7 ml 23.19 ml/m AORTIC VALVE LVOT Vmax:   101.00 cm/s LVOT Vmean:  62.600 cm/s LVOT VTI:    0.169 m  AORTA Ao Root diam: 2.80 cm Ao Asc diam:  3.30 cm Ao Desc diam: 2.60 cm  MITRAL VALVE               TRICUSPID VALVE MV Area (PHT): 3.97 cm    TR Peak grad:   9.6 mmHg MV Decel Time: 191 msec    TR Vmax:        155.00 cm/s MV E velocity: 60.80 cm/s MV A velocity: 83.60 cm/s  SHUNTS MV E/A ratio:  0.73        Systemic VTI:  0.17 m Systemic Diam: 2.00 cm  Gypsy Balsam MD Electronically signed by Gypsy Balsam MD Signature Date/Time: 06/04/2022/12:14:36 PM    Final             EKG Interpretation Date/Time:  Thursday May 16 2023 09:07:21 EST Ventricular Rate:  88 PR Interval:  164 QRS Duration:  100 QT Interval:  374 QTC Calculation: 452 R Axis:   88  Text Interpretation: Sinus rhythm with Premature supraventricular complexes Minimal voltage criteria for LVH, may be normal variant ( Sokolow-Lyon ) When compared with ECG of 29-Jul-2016 10:22, Premature supraventricular  complexes are now Present Confirmed by Norman Herrlich ([id number]) on 05/16/2023 9:19:40 AM   Recent Labs: 05/22/2022: Hemoglobin 12.0; Platelets 285 07/09/2022: BUN 41; Creatinine, Ser 1.59; NT-Pro BNP 750; Potassium 4.8; Sodium 139  Recent Lipid Panel    Component Value Date/Time   CHOL 193 11/12/2019 0445   TRIG 71 11/12/2019 0445   HDL 51 11/12/2019 0445   CHOLHDL 3.8 11/12/2019 0445   VLDL 14 11/12/2019 0445   LDLCALC 128 (H) 11/12/2019 0445    Physical Exam:    VS:  BP (!) 170/72   Pulse 81   Wt 136 lb 12.8 oz (62.1 kg)   SpO2 92%   BMI 17.56 kg/m     Wt Readings from Last 3 Encounters:  05/16/23 136 lb 12.8 oz (62.1 kg)  10/12/22 137 lb 3.2 oz (62.2 kg)  07/09/22 137 lb (62.1 kg)     GEN:  Well nourished, well developed in no acute distress HEENT: Normal NECK: No JVD; No carotid bruits LYMPHATICS: No lymphadenopathy CARDIAC: RRR, no murmurs, rubs, gallops RESPIRATORY: He has diffuse expiratory wheezing ABDOMEN: Soft, non-tender, non-distended MUSCULOSKELETAL:  No edema; No deformity  SKIN: Warm and dry NEUROLOGIC:  Alert and oriented x 3 PSYCHIATRIC:  Normal affect    Signed, Norman Herrlich, MD  05/16/2023 9:25 AM    [facility]

## 2023-05-16 ENCOUNTER — Encounter: Payer: Self-pay | Admitting: Cardiology

## 2023-05-16 ENCOUNTER — Ambulatory Visit: Payer: Medicare HMO | Attending: Cardiology | Admitting: Cardiology

## 2023-05-16 VITALS — BP 140/70 | HR 81 | Wt 136.8 lb

## 2023-05-16 DIAGNOSIS — I5042 Chronic combined systolic (congestive) and diastolic (congestive) heart failure: Secondary | ICD-10-CM

## 2023-05-16 DIAGNOSIS — E782 Mixed hyperlipidemia: Secondary | ICD-10-CM

## 2023-05-16 DIAGNOSIS — J449 Chronic obstructive pulmonary disease, unspecified: Secondary | ICD-10-CM

## 2023-05-16 DIAGNOSIS — I110 Hypertensive heart disease with heart failure: Secondary | ICD-10-CM | POA: Diagnosis not present

## 2023-05-16 NOTE — Patient Instructions (Signed)
Medication Instructions:  Your physician recommends that you continue on your current medications as directed. Please refer to the Current Medication list given to you today.  *If you need a refill on your cardiac medications before your next appointment, please call your pharmacy*   Lab Work: Your physician recommends that you return for lab work in:   Labs today: CMP, Lipids, CBC, Pro BNP  If you have labs (blood work) drawn today and your tests are completely normal, you will receive your results only by: MyChart Message (if you have MyChart) OR A paper copy in the mail If you have any lab test that is abnormal or we need to change your treatment, we will call you to review the results.   Testing/Procedures: None   Follow-Up: At [facility], you and your health needs are our priority.  As part of our continuing mission to provide you with exceptional heart care, we have created designated Provider Care Teams.  These Care Teams include your primary Cardiologist (physician) and Advanced Practice Providers (APPs -  Physician Assistants and Nurse Practitioners) who all work together to provide you with the care you need, when you need it.  We recommend signing up for the patient portal called "MyChart".  Sign up information is provided on this After Visit Summary.  MyChart is used to connect with patients for Virtual Visits (Telemedicine).  Patients are able to view lab/test results, encounter notes, upcoming appointments, etc.  Non-urgent messages can be sent to your provider as well.   To learn more about what you can do with MyChart, go to [email or web address].    Your next appointment:   6 month(s)  Provider:   Norman Herrlich, MD    Other Instructions None

## 2023-05-16 NOTE — Addendum Note (Signed)
Addended by: Roxanne Mins I on: 05/16/2023 09:39 AM   Modules accepted: Orders

## 2023-05-17 LAB — CBC
Hematocrit: 35.4 % — ABNORMAL LOW (ref 37.5–51.0)
Hemoglobin: 11.3 g/dL — ABNORMAL LOW (ref 13.0–17.7)
MCH: 31.3 pg (ref 26.6–33.0)
MCHC: 31.9 g/dL (ref 31.5–35.7)
MCV: 98 fL — ABNORMAL HIGH (ref 79–97)
Platelets: 250 10*3/uL (ref 150–450)
RBC: 3.61 x10E6/uL — ABNORMAL LOW (ref 4.14–5.80)
RDW: 13.1 % (ref 11.6–15.4)
WBC: 4.4 10*3/uL (ref 3.4–10.8)

## 2023-05-17 LAB — LIPID PANEL
Chol/HDL Ratio: 3.2 {ratio} (ref 0.0–5.0)
Cholesterol, Total: 159 mg/dL (ref 100–199)
HDL: 49 mg/dL (ref >39)
LDL Chol Calc (NIH): 89 mg/dL (ref 0–99)
Triglycerides: 118 mg/dL (ref 0–149)
VLDL Cholesterol Cal: 21 mg/dL (ref 5–40)

## 2023-05-17 LAB — COMPREHENSIVE METABOLIC PANEL
ALT: 6 [IU]/L (ref 0–44)
AST: 9 [IU]/L (ref 0–40)
Albumin: 3.5 g/dL — ABNORMAL LOW (ref 3.7–4.7)
Alkaline Phosphatase: 102 [IU]/L (ref 44–121)
BUN/Creatinine Ratio: 21 (ref 10–24)
BUN: 31 mg/dL — ABNORMAL HIGH (ref 8–27)
Bilirubin Total: <0.2 mg/dL (ref 0.0–1.2)
CO2: 24 mmol/L (ref 20–29)
Calcium: 8.7 mg/dL (ref 8.6–10.2)
Chloride: 105 mmol/L (ref 96–106)
Creatinine, Ser: 1.46 mg/dL — ABNORMAL HIGH (ref 0.76–1.27)
Globulin, Total: 3.4 g/dL (ref 1.5–4.5)
Glucose: 85 mg/dL (ref 70–99)
Potassium: 4.9 mmol/L (ref 3.5–5.2)
Sodium: 145 mmol/L — ABNORMAL HIGH (ref 134–144)
Total Protein: 6.9 g/dL (ref 6.0–8.5)
eGFR: 47 mL/min/{1.73_m2} — ABNORMAL LOW (ref >59)

## 2023-05-17 LAB — PRO B NATRIURETIC PEPTIDE: NT-Pro BNP: 3515 pg/mL — ABNORMAL HIGH (ref 0–486)

## 2023-05-18 DIAGNOSIS — J449 Chronic obstructive pulmonary disease, unspecified: Secondary | ICD-10-CM | POA: Diagnosis not present

## 2023-05-18 DIAGNOSIS — M25551 Pain in right hip: Secondary | ICD-10-CM | POA: Diagnosis not present

## 2023-05-18 DIAGNOSIS — S0990XA Unspecified injury of head, initial encounter: Secondary | ICD-10-CM | POA: Diagnosis not present

## 2023-05-18 DIAGNOSIS — E875 Hyperkalemia: Secondary | ICD-10-CM | POA: Diagnosis not present

## 2023-05-18 DIAGNOSIS — R918 Other nonspecific abnormal finding of lung field: Secondary | ICD-10-CM | POA: Diagnosis not present

## 2023-05-18 DIAGNOSIS — S199XXA Unspecified injury of neck, initial encounter: Secondary | ICD-10-CM | POA: Diagnosis not present

## 2023-05-18 DIAGNOSIS — M109 Gout, unspecified: Secondary | ICD-10-CM | POA: Diagnosis not present

## 2023-05-18 DIAGNOSIS — J9601 Acute respiratory failure with hypoxia: Secondary | ICD-10-CM | POA: Diagnosis not present

## 2023-05-18 DIAGNOSIS — M25531 Pain in right wrist: Secondary | ICD-10-CM | POA: Diagnosis not present

## 2023-05-18 DIAGNOSIS — S299XXA Unspecified injury of thorax, initial encounter: Secondary | ICD-10-CM | POA: Diagnosis not present

## 2023-05-18 DIAGNOSIS — R0781 Pleurodynia: Secondary | ICD-10-CM | POA: Diagnosis not present

## 2023-05-18 DIAGNOSIS — I129 Hypertensive chronic kidney disease with stage 1 through stage 4 chronic kidney disease, or unspecified chronic kidney disease: Secondary | ICD-10-CM | POA: Diagnosis not present

## 2023-05-18 DIAGNOSIS — J441 Chronic obstructive pulmonary disease with (acute) exacerbation: Secondary | ICD-10-CM | POA: Diagnosis not present

## 2023-05-18 DIAGNOSIS — I959 Hypotension, unspecified: Secondary | ICD-10-CM | POA: Diagnosis not present

## 2023-05-18 DIAGNOSIS — R0602 Shortness of breath: Secondary | ICD-10-CM | POA: Diagnosis not present

## 2023-05-18 DIAGNOSIS — I429 Cardiomyopathy, unspecified: Secondary | ICD-10-CM | POA: Diagnosis not present

## 2023-05-18 DIAGNOSIS — Z681 Body mass index (BMI) 19 or less, adult: Secondary | ICD-10-CM | POA: Diagnosis not present

## 2023-05-18 DIAGNOSIS — Z87440 Personal history of urinary (tract) infections: Secondary | ICD-10-CM | POA: Diagnosis not present

## 2023-05-18 DIAGNOSIS — J181 Lobar pneumonia, unspecified organism: Secondary | ICD-10-CM | POA: Diagnosis not present

## 2023-05-18 DIAGNOSIS — R9431 Abnormal electrocardiogram [ECG] [EKG]: Secondary | ICD-10-CM | POA: Diagnosis not present

## 2023-05-18 DIAGNOSIS — N183 Chronic kidney disease, stage 3 unspecified: Secondary | ICD-10-CM | POA: Diagnosis not present

## 2023-05-18 DIAGNOSIS — R6521 Severe sepsis with septic shock: Secondary | ICD-10-CM | POA: Diagnosis not present

## 2023-05-18 DIAGNOSIS — A419 Sepsis, unspecified organism: Secondary | ICD-10-CM | POA: Diagnosis not present

## 2023-05-18 DIAGNOSIS — J440 Chronic obstructive pulmonary disease with acute lower respiratory infection: Secondary | ICD-10-CM | POA: Diagnosis not present

## 2023-05-18 DIAGNOSIS — I361 Nonrheumatic tricuspid (valve) insufficiency: Secondary | ICD-10-CM | POA: Diagnosis not present

## 2023-05-18 DIAGNOSIS — M6282 Rhabdomyolysis: Secondary | ICD-10-CM | POA: Diagnosis not present

## 2023-05-18 DIAGNOSIS — R64 Cachexia: Secondary | ICD-10-CM | POA: Diagnosis not present

## 2023-05-18 DIAGNOSIS — R0902 Hypoxemia: Secondary | ICD-10-CM | POA: Diagnosis not present

## 2023-05-18 DIAGNOSIS — D539 Nutritional anemia, unspecified: Secondary | ICD-10-CM | POA: Diagnosis not present

## 2023-05-18 DIAGNOSIS — N179 Acute kidney failure, unspecified: Secondary | ICD-10-CM | POA: Diagnosis not present

## 2023-05-18 DIAGNOSIS — R0789 Other chest pain: Secondary | ICD-10-CM | POA: Diagnosis not present

## 2023-05-18 DIAGNOSIS — H9190 Unspecified hearing loss, unspecified ear: Secondary | ICD-10-CM | POA: Diagnosis not present

## 2023-05-18 DIAGNOSIS — M25572 Pain in left ankle and joints of left foot: Secondary | ICD-10-CM | POA: Diagnosis not present

## 2023-05-18 DIAGNOSIS — M1990 Unspecified osteoarthritis, unspecified site: Secondary | ICD-10-CM | POA: Diagnosis not present

## 2023-05-18 DIAGNOSIS — Z743 Need for continuous supervision: Secondary | ICD-10-CM | POA: Diagnosis not present

## 2023-05-18 DEATH — deceased

## 2023-05-19 DIAGNOSIS — I361 Nonrheumatic tricuspid (valve) insufficiency: Secondary | ICD-10-CM | POA: Diagnosis not present

## 2023-05-19 DIAGNOSIS — A419 Sepsis, unspecified organism: Secondary | ICD-10-CM | POA: Diagnosis not present

## 2023-05-19 DIAGNOSIS — R0789 Other chest pain: Secondary | ICD-10-CM | POA: Diagnosis not present

## 2023-05-19 DIAGNOSIS — R9431 Abnormal electrocardiogram [ECG] [EKG]: Secondary | ICD-10-CM | POA: Diagnosis not present

## 2023-05-19 DIAGNOSIS — J449 Chronic obstructive pulmonary disease, unspecified: Secondary | ICD-10-CM | POA: Diagnosis not present

## 2023-06-15 DEATH — deceased

## 2023-06-25 ENCOUNTER — Encounter: Payer: Self-pay | Admitting: Specialist
